# Patient Record
Sex: Male | Born: 1955 | Race: Black or African American | Hispanic: No | Marital: Single | Smoking: Never smoker
Health system: Southern US, Community
[De-identification: ages and names within clinical notes are randomized; demographics above are authoritative.]

## PROBLEM LIST (undated history)

## (undated) DIAGNOSIS — B20 Human immunodeficiency virus [HIV] disease: Secondary | ICD-10-CM

## (undated) HISTORY — PX: ABDOMINAL SURGERY: SHX537

## (undated) HISTORY — DX: Human immunodeficiency virus (HIV) disease: B20

---

## 2000-12-19 ENCOUNTER — Emergency Department (HOSPITAL_COMMUNITY): Admission: EM | Admit: 2000-12-19 | Discharge: 2000-12-19 | Payer: Self-pay | Admitting: Emergency Medicine

## 2000-12-22 ENCOUNTER — Emergency Department (HOSPITAL_COMMUNITY): Admission: EM | Admit: 2000-12-22 | Discharge: 2000-12-22 | Payer: Self-pay | Admitting: Emergency Medicine

## 2001-04-10 ENCOUNTER — Emergency Department (HOSPITAL_COMMUNITY): Admission: EM | Admit: 2001-04-10 | Discharge: 2001-04-10 | Payer: Self-pay | Admitting: Emergency Medicine

## 2001-04-16 ENCOUNTER — Emergency Department (HOSPITAL_COMMUNITY): Admission: EM | Admit: 2001-04-16 | Discharge: 2001-04-16 | Payer: Self-pay | Admitting: Emergency Medicine

## 2001-04-19 ENCOUNTER — Encounter: Payer: Self-pay | Admitting: *Deleted

## 2001-04-19 ENCOUNTER — Encounter (INDEPENDENT_AMBULATORY_CARE_PROVIDER_SITE_OTHER): Payer: Self-pay | Admitting: *Deleted

## 2001-04-20 ENCOUNTER — Inpatient Hospital Stay (HOSPITAL_COMMUNITY): Admission: EM | Admit: 2001-04-20 | Discharge: 2001-05-06 | Payer: Self-pay | Admitting: *Deleted

## 2001-04-20 ENCOUNTER — Encounter: Payer: Self-pay | Admitting: Pulmonary Disease

## 2001-04-20 ENCOUNTER — Encounter: Payer: Self-pay | Admitting: Internal Medicine

## 2001-04-20 ENCOUNTER — Encounter (INDEPENDENT_AMBULATORY_CARE_PROVIDER_SITE_OTHER): Payer: Self-pay | Admitting: *Deleted

## 2001-04-20 LAB — CONVERTED CEMR LAB
CD4 Count: 10 microliters
CD4 T Cell Abs: 10

## 2001-04-21 ENCOUNTER — Encounter: Payer: Self-pay | Admitting: Internal Medicine

## 2001-04-22 ENCOUNTER — Encounter: Payer: Self-pay | Admitting: Internal Medicine

## 2001-04-23 ENCOUNTER — Encounter: Payer: Self-pay | Admitting: Critical Care Medicine

## 2001-04-24 ENCOUNTER — Encounter: Payer: Self-pay | Admitting: Critical Care Medicine

## 2001-04-25 ENCOUNTER — Encounter: Payer: Self-pay | Admitting: Critical Care Medicine

## 2001-04-30 ENCOUNTER — Encounter: Payer: Self-pay | Admitting: Internal Medicine

## 2001-05-07 ENCOUNTER — Encounter: Payer: Self-pay | Admitting: Emergency Medicine

## 2001-05-07 ENCOUNTER — Emergency Department (HOSPITAL_COMMUNITY): Admission: EM | Admit: 2001-05-07 | Discharge: 2001-05-07 | Payer: Self-pay | Admitting: *Deleted

## 2001-05-15 ENCOUNTER — Encounter: Admission: RE | Admit: 2001-05-15 | Discharge: 2001-05-15 | Payer: Self-pay | Admitting: Infectious Diseases

## 2001-05-23 ENCOUNTER — Inpatient Hospital Stay (HOSPITAL_COMMUNITY): Admission: EM | Admit: 2001-05-23 | Discharge: 2001-05-27 | Payer: Self-pay | Admitting: Emergency Medicine

## 2001-05-23 ENCOUNTER — Encounter: Payer: Self-pay | Admitting: Emergency Medicine

## 2001-05-24 ENCOUNTER — Encounter: Payer: Self-pay | Admitting: Internal Medicine

## 2001-05-25 ENCOUNTER — Encounter: Payer: Self-pay | Admitting: Internal Medicine

## 2001-06-10 ENCOUNTER — Emergency Department (HOSPITAL_COMMUNITY): Admission: EM | Admit: 2001-06-10 | Discharge: 2001-06-10 | Payer: Self-pay

## 2001-06-11 ENCOUNTER — Encounter: Admission: RE | Admit: 2001-06-11 | Discharge: 2001-06-11 | Payer: Self-pay | Admitting: Infectious Diseases

## 2001-06-13 ENCOUNTER — Emergency Department (HOSPITAL_COMMUNITY): Admission: EM | Admit: 2001-06-13 | Discharge: 2001-06-13 | Payer: Self-pay | Admitting: Emergency Medicine

## 2001-06-14 ENCOUNTER — Emergency Department (HOSPITAL_COMMUNITY): Admission: EM | Admit: 2001-06-14 | Discharge: 2001-06-14 | Payer: Self-pay | Admitting: Emergency Medicine

## 2001-06-14 ENCOUNTER — Encounter: Payer: Self-pay | Admitting: Emergency Medicine

## 2001-06-19 ENCOUNTER — Encounter: Admission: RE | Admit: 2001-06-19 | Discharge: 2001-06-19 | Payer: Self-pay | Admitting: Infectious Diseases

## 2001-07-01 ENCOUNTER — Encounter: Payer: Self-pay | Admitting: Emergency Medicine

## 2001-07-01 ENCOUNTER — Emergency Department (HOSPITAL_COMMUNITY): Admission: EM | Admit: 2001-07-01 | Discharge: 2001-07-01 | Payer: Self-pay | Admitting: Emergency Medicine

## 2001-07-02 ENCOUNTER — Encounter: Admission: RE | Admit: 2001-07-02 | Discharge: 2001-07-02 | Payer: Self-pay

## 2001-07-05 ENCOUNTER — Encounter: Admission: RE | Admit: 2001-07-05 | Discharge: 2001-07-05 | Payer: Self-pay | Admitting: Infectious Diseases

## 2001-07-08 ENCOUNTER — Emergency Department (HOSPITAL_COMMUNITY): Admission: EM | Admit: 2001-07-08 | Discharge: 2001-07-08 | Payer: Self-pay | Admitting: Emergency Medicine

## 2001-07-11 ENCOUNTER — Emergency Department (HOSPITAL_COMMUNITY): Admission: EM | Admit: 2001-07-11 | Discharge: 2001-07-11 | Payer: Self-pay | Admitting: *Deleted

## 2001-08-21 ENCOUNTER — Ambulatory Visit (HOSPITAL_COMMUNITY): Admission: RE | Admit: 2001-08-21 | Discharge: 2001-08-21 | Payer: Self-pay | Admitting: Infectious Diseases

## 2001-08-21 ENCOUNTER — Encounter: Admission: RE | Admit: 2001-08-21 | Discharge: 2001-08-21 | Payer: Self-pay | Admitting: Infectious Diseases

## 2001-10-02 ENCOUNTER — Encounter: Admission: RE | Admit: 2001-10-02 | Discharge: 2001-10-02 | Payer: Self-pay | Admitting: Infectious Diseases

## 2001-10-02 ENCOUNTER — Ambulatory Visit (HOSPITAL_COMMUNITY): Admission: RE | Admit: 2001-10-02 | Discharge: 2001-10-02 | Payer: Self-pay | Admitting: Infectious Diseases

## 2001-10-08 ENCOUNTER — Ambulatory Visit (HOSPITAL_COMMUNITY): Admission: RE | Admit: 2001-10-08 | Discharge: 2001-10-08 | Payer: Self-pay | Admitting: Infectious Diseases

## 2001-10-08 ENCOUNTER — Encounter: Payer: Self-pay | Admitting: Infectious Diseases

## 2001-10-22 ENCOUNTER — Encounter: Payer: Self-pay | Admitting: Infectious Diseases

## 2001-10-22 ENCOUNTER — Ambulatory Visit (HOSPITAL_COMMUNITY): Admission: RE | Admit: 2001-10-22 | Discharge: 2001-10-22 | Payer: Self-pay | Admitting: Infectious Diseases

## 2001-10-24 ENCOUNTER — Emergency Department (HOSPITAL_COMMUNITY): Admission: EM | Admit: 2001-10-24 | Discharge: 2001-10-24 | Payer: Self-pay | Admitting: Emergency Medicine

## 2001-10-30 ENCOUNTER — Ambulatory Visit (HOSPITAL_COMMUNITY): Admission: RE | Admit: 2001-10-30 | Discharge: 2001-10-30 | Payer: Self-pay | Admitting: Infectious Diseases

## 2001-10-30 ENCOUNTER — Encounter (INDEPENDENT_AMBULATORY_CARE_PROVIDER_SITE_OTHER): Payer: Self-pay | Admitting: *Deleted

## 2001-10-30 ENCOUNTER — Encounter: Payer: Self-pay | Admitting: Infectious Diseases

## 2001-11-06 ENCOUNTER — Encounter: Admission: RE | Admit: 2001-11-06 | Discharge: 2001-11-06 | Payer: Self-pay | Admitting: Infectious Diseases

## 2001-12-03 ENCOUNTER — Encounter: Admission: RE | Admit: 2001-12-03 | Discharge: 2001-12-03 | Payer: Self-pay | Admitting: Infectious Diseases

## 2001-12-03 ENCOUNTER — Ambulatory Visit (HOSPITAL_COMMUNITY): Admission: RE | Admit: 2001-12-03 | Discharge: 2001-12-03 | Payer: Self-pay | Admitting: Infectious Diseases

## 2001-12-06 ENCOUNTER — Encounter: Payer: Self-pay | Admitting: Infectious Diseases

## 2001-12-06 ENCOUNTER — Ambulatory Visit (HOSPITAL_COMMUNITY): Admission: RE | Admit: 2001-12-06 | Discharge: 2001-12-06 | Payer: Self-pay | Admitting: Infectious Diseases

## 2001-12-10 ENCOUNTER — Encounter: Admission: RE | Admit: 2001-12-10 | Discharge: 2001-12-10 | Payer: Self-pay | Admitting: Infectious Diseases

## 2001-12-25 ENCOUNTER — Encounter: Admission: RE | Admit: 2001-12-25 | Discharge: 2001-12-25 | Payer: Self-pay | Admitting: Infectious Diseases

## 2002-01-15 ENCOUNTER — Ambulatory Visit (HOSPITAL_COMMUNITY): Admission: RE | Admit: 2002-01-15 | Discharge: 2002-01-15 | Payer: Self-pay | Admitting: Infectious Diseases

## 2002-01-15 ENCOUNTER — Encounter: Admission: RE | Admit: 2002-01-15 | Discharge: 2002-01-15 | Payer: Self-pay | Admitting: Infectious Diseases

## 2002-02-12 ENCOUNTER — Encounter: Admission: RE | Admit: 2002-02-12 | Discharge: 2002-02-12 | Payer: Self-pay | Admitting: Infectious Diseases

## 2002-03-10 ENCOUNTER — Encounter: Admission: RE | Admit: 2002-03-10 | Discharge: 2002-03-10 | Payer: Self-pay | Admitting: Infectious Diseases

## 2002-03-10 ENCOUNTER — Ambulatory Visit (HOSPITAL_COMMUNITY): Admission: RE | Admit: 2002-03-10 | Discharge: 2002-03-10 | Payer: Self-pay | Admitting: Infectious Diseases

## 2002-03-19 ENCOUNTER — Encounter: Admission: RE | Admit: 2002-03-19 | Discharge: 2002-03-19 | Payer: Self-pay | Admitting: Infectious Diseases

## 2002-04-28 ENCOUNTER — Encounter: Admission: RE | Admit: 2002-04-28 | Discharge: 2002-04-28 | Payer: Self-pay | Admitting: Infectious Diseases

## 2002-04-28 ENCOUNTER — Ambulatory Visit (HOSPITAL_COMMUNITY): Admission: RE | Admit: 2002-04-28 | Discharge: 2002-04-28 | Payer: Self-pay | Admitting: Infectious Diseases

## 2002-08-06 ENCOUNTER — Encounter: Admission: RE | Admit: 2002-08-06 | Discharge: 2002-08-06 | Payer: Self-pay | Admitting: Infectious Diseases

## 2002-08-06 ENCOUNTER — Encounter: Payer: Self-pay | Admitting: Infectious Diseases

## 2002-08-14 ENCOUNTER — Encounter: Admission: RE | Admit: 2002-08-14 | Discharge: 2002-08-14 | Payer: Self-pay | Admitting: Infectious Diseases

## 2002-08-27 ENCOUNTER — Encounter: Admission: RE | Admit: 2002-08-27 | Discharge: 2002-08-27 | Payer: Self-pay | Admitting: Infectious Diseases

## 2002-09-10 ENCOUNTER — Encounter: Admission: RE | Admit: 2002-09-10 | Discharge: 2002-09-10 | Payer: Self-pay | Admitting: Infectious Diseases

## 2002-11-05 ENCOUNTER — Encounter: Payer: Self-pay | Admitting: Infectious Diseases

## 2002-11-05 ENCOUNTER — Encounter: Admission: RE | Admit: 2002-11-05 | Discharge: 2002-11-05 | Payer: Self-pay | Admitting: Infectious Diseases

## 2002-11-05 ENCOUNTER — Ambulatory Visit (HOSPITAL_COMMUNITY): Admission: RE | Admit: 2002-11-05 | Discharge: 2002-11-05 | Payer: Self-pay | Admitting: Infectious Diseases

## 2002-12-31 ENCOUNTER — Encounter: Payer: Self-pay | Admitting: Infectious Diseases

## 2002-12-31 ENCOUNTER — Encounter: Admission: RE | Admit: 2002-12-31 | Discharge: 2002-12-31 | Payer: Self-pay | Admitting: Infectious Diseases

## 2002-12-31 ENCOUNTER — Ambulatory Visit (HOSPITAL_COMMUNITY): Admission: RE | Admit: 2002-12-31 | Discharge: 2002-12-31 | Payer: Self-pay | Admitting: Infectious Diseases

## 2003-01-05 ENCOUNTER — Ambulatory Visit (HOSPITAL_COMMUNITY): Admission: RE | Admit: 2003-01-05 | Discharge: 2003-01-05 | Payer: Self-pay | Admitting: Infectious Diseases

## 2003-01-05 ENCOUNTER — Encounter: Payer: Self-pay | Admitting: Infectious Diseases

## 2003-01-14 ENCOUNTER — Ambulatory Visit (HOSPITAL_COMMUNITY): Admission: RE | Admit: 2003-01-14 | Discharge: 2003-01-14 | Payer: Self-pay | Admitting: Infectious Diseases

## 2003-01-14 ENCOUNTER — Encounter: Payer: Self-pay | Admitting: Infectious Diseases

## 2003-01-14 ENCOUNTER — Encounter: Admission: RE | Admit: 2003-01-14 | Discharge: 2003-01-14 | Payer: Self-pay | Admitting: Infectious Diseases

## 2003-02-04 ENCOUNTER — Encounter: Admission: RE | Admit: 2003-02-04 | Discharge: 2003-02-04 | Payer: Self-pay | Admitting: Infectious Diseases

## 2003-02-26 ENCOUNTER — Encounter: Admission: RE | Admit: 2003-02-26 | Discharge: 2003-02-26 | Payer: Self-pay | Admitting: Gastroenterology

## 2003-03-23 ENCOUNTER — Ambulatory Visit (HOSPITAL_COMMUNITY): Admission: RE | Admit: 2003-03-23 | Discharge: 2003-03-23 | Payer: Self-pay | Admitting: Infectious Diseases

## 2003-03-23 ENCOUNTER — Encounter: Payer: Self-pay | Admitting: Infectious Diseases

## 2003-03-23 ENCOUNTER — Encounter: Admission: RE | Admit: 2003-03-23 | Discharge: 2003-03-23 | Payer: Self-pay | Admitting: Infectious Diseases

## 2003-04-27 ENCOUNTER — Encounter: Admission: RE | Admit: 2003-04-27 | Discharge: 2003-04-27 | Payer: Self-pay | Admitting: Gastroenterology

## 2003-04-30 ENCOUNTER — Ambulatory Visit (HOSPITAL_COMMUNITY): Admission: RE | Admit: 2003-04-30 | Discharge: 2003-04-30 | Payer: Self-pay | Admitting: Gastroenterology

## 2003-06-17 ENCOUNTER — Ambulatory Visit (HOSPITAL_COMMUNITY): Admission: RE | Admit: 2003-06-17 | Discharge: 2003-06-17 | Payer: Self-pay | Admitting: Infectious Diseases

## 2003-06-17 ENCOUNTER — Encounter: Admission: RE | Admit: 2003-06-17 | Discharge: 2003-06-17 | Payer: Self-pay | Admitting: Infectious Diseases

## 2003-09-15 ENCOUNTER — Encounter: Admission: RE | Admit: 2003-09-15 | Discharge: 2003-09-15 | Payer: Self-pay | Admitting: Infectious Diseases

## 2003-12-16 ENCOUNTER — Ambulatory Visit (HOSPITAL_COMMUNITY): Admission: RE | Admit: 2003-12-16 | Discharge: 2003-12-16 | Payer: Self-pay | Admitting: Infectious Diseases

## 2003-12-16 ENCOUNTER — Encounter: Admission: RE | Admit: 2003-12-16 | Discharge: 2003-12-16 | Payer: Self-pay | Admitting: Infectious Diseases

## 2004-02-24 ENCOUNTER — Ambulatory Visit (HOSPITAL_COMMUNITY): Admission: RE | Admit: 2004-02-24 | Discharge: 2004-02-24 | Payer: Self-pay | Admitting: Infectious Diseases

## 2004-02-24 ENCOUNTER — Ambulatory Visit: Payer: Self-pay | Admitting: Infectious Diseases

## 2004-03-04 ENCOUNTER — Emergency Department (HOSPITAL_COMMUNITY): Admission: EM | Admit: 2004-03-04 | Discharge: 2004-03-04 | Payer: Self-pay | Admitting: *Deleted

## 2004-03-06 ENCOUNTER — Emergency Department (HOSPITAL_COMMUNITY): Admission: EM | Admit: 2004-03-06 | Discharge: 2004-03-06 | Payer: Self-pay | Admitting: Emergency Medicine

## 2004-04-25 ENCOUNTER — Ambulatory Visit: Payer: Self-pay | Admitting: Infectious Diseases

## 2004-05-26 ENCOUNTER — Emergency Department (HOSPITAL_COMMUNITY): Admission: EM | Admit: 2004-05-26 | Discharge: 2004-05-27 | Payer: Self-pay | Admitting: Emergency Medicine

## 2004-05-29 ENCOUNTER — Emergency Department (HOSPITAL_COMMUNITY): Admission: EM | Admit: 2004-05-29 | Discharge: 2004-05-29 | Payer: Self-pay | Admitting: Emergency Medicine

## 2004-06-07 ENCOUNTER — Emergency Department (HOSPITAL_COMMUNITY): Admission: EM | Admit: 2004-06-07 | Discharge: 2004-06-07 | Payer: Self-pay | Admitting: Emergency Medicine

## 2004-08-23 ENCOUNTER — Ambulatory Visit (HOSPITAL_COMMUNITY): Admission: RE | Admit: 2004-08-23 | Discharge: 2004-08-23 | Payer: Self-pay | Admitting: Infectious Diseases

## 2004-08-23 ENCOUNTER — Ambulatory Visit: Payer: Self-pay | Admitting: Infectious Diseases

## 2004-11-23 ENCOUNTER — Ambulatory Visit (HOSPITAL_COMMUNITY): Admission: RE | Admit: 2004-11-23 | Discharge: 2004-11-23 | Payer: Self-pay | Admitting: Infectious Diseases

## 2004-11-23 ENCOUNTER — Ambulatory Visit: Payer: Self-pay | Admitting: Infectious Diseases

## 2005-01-01 ENCOUNTER — Emergency Department (HOSPITAL_COMMUNITY): Admission: EM | Admit: 2005-01-01 | Discharge: 2005-01-01 | Payer: Self-pay | Admitting: Emergency Medicine

## 2005-02-01 ENCOUNTER — Ambulatory Visit (HOSPITAL_COMMUNITY): Admission: RE | Admit: 2005-02-01 | Discharge: 2005-02-01 | Payer: Self-pay | Admitting: Infectious Diseases

## 2005-02-01 ENCOUNTER — Encounter (INDEPENDENT_AMBULATORY_CARE_PROVIDER_SITE_OTHER): Payer: Self-pay | Admitting: *Deleted

## 2005-02-01 ENCOUNTER — Ambulatory Visit: Payer: Self-pay | Admitting: Infectious Diseases

## 2005-02-01 LAB — CONVERTED CEMR LAB
CD4 Count: 180 microliters
HIV 1 RNA Quant: 14200 copies/mL

## 2005-03-08 ENCOUNTER — Ambulatory Visit: Payer: Self-pay | Admitting: Infectious Diseases

## 2005-03-31 ENCOUNTER — Ambulatory Visit: Payer: Self-pay | Admitting: Infectious Diseases

## 2005-03-31 ENCOUNTER — Ambulatory Visit (HOSPITAL_COMMUNITY): Admission: RE | Admit: 2005-03-31 | Discharge: 2005-03-31 | Payer: Self-pay | Admitting: Infectious Diseases

## 2005-03-31 ENCOUNTER — Encounter (INDEPENDENT_AMBULATORY_CARE_PROVIDER_SITE_OTHER): Payer: Self-pay | Admitting: *Deleted

## 2006-05-30 ENCOUNTER — Emergency Department (HOSPITAL_COMMUNITY): Admission: EM | Admit: 2006-05-30 | Discharge: 2006-05-30 | Payer: Self-pay | Admitting: Emergency Medicine

## 2006-06-11 ENCOUNTER — Encounter (INDEPENDENT_AMBULATORY_CARE_PROVIDER_SITE_OTHER): Payer: Self-pay | Admitting: *Deleted

## 2006-06-11 LAB — CONVERTED CEMR LAB: RPR Titer: 4

## 2006-06-24 ENCOUNTER — Encounter (INDEPENDENT_AMBULATORY_CARE_PROVIDER_SITE_OTHER): Payer: Self-pay | Admitting: *Deleted

## 2006-08-21 ENCOUNTER — Ambulatory Visit: Payer: Self-pay | Admitting: Infectious Diseases

## 2006-08-21 ENCOUNTER — Encounter: Admission: RE | Admit: 2006-08-21 | Discharge: 2006-08-21 | Payer: Self-pay | Admitting: Infectious Diseases

## 2006-08-21 DIAGNOSIS — A530 Latent syphilis, unspecified as early or late: Secondary | ICD-10-CM | POA: Insufficient documentation

## 2006-08-21 DIAGNOSIS — Z8709 Personal history of other diseases of the respiratory system: Secondary | ICD-10-CM | POA: Insufficient documentation

## 2006-08-21 DIAGNOSIS — B20 Human immunodeficiency virus [HIV] disease: Secondary | ICD-10-CM | POA: Insufficient documentation

## 2006-08-21 DIAGNOSIS — K589 Irritable bowel syndrome without diarrhea: Secondary | ICD-10-CM | POA: Insufficient documentation

## 2006-08-21 LAB — CONVERTED CEMR LAB
ALT: 29 units/L (ref 0–53)
Alkaline Phosphatase: 70 units/L (ref 39–117)
Basophils Absolute: 0 10*3/uL (ref 0.0–0.1)
Basophils Relative: 1 % (ref 0–1)
CD4 Count: 80 microliters
Creatinine, Ser: 0.78 mg/dL (ref 0.40–1.50)
Eosinophils Absolute: 0.1 10*3/uL (ref 0.0–0.7)
Eosinophils Relative: 4 % (ref 0–5)
HCT: 43.5 % (ref 39.0–52.0)
HIV-1 RNA Quant, Log: 5.19 — ABNORMAL HIGH (ref <1.70)
Hemoglobin, Urine: NEGATIVE
Hemoglobin: 14.9 g/dL (ref 13.0–17.0)
Ketones, ur: NEGATIVE mg/dL
LDL Cholesterol: 69 mg/dL (ref 0–99)
Leukocytes, UA: NEGATIVE
MCHC: 34.3 g/dL (ref 30.0–36.0)
MCV: 95.3 fL (ref 78.0–100.0)
Monocytes Absolute: 0.2 10*3/uL (ref 0.2–0.7)
Platelets: 128 10*3/uL — ABNORMAL LOW (ref 150–400)
Protein, ur: 30 mg/dL — AB
RDW: 13.4 % (ref 11.5–14.0)
RPR Titer: 1:2 {titer}
Sodium: 139 meq/L (ref 135–145)
Total Bilirubin: 1.2 mg/dL (ref 0.3–1.2)
Total CHOL/HDL Ratio: 3.6
Total Protein: 8 g/dL (ref 6.0–8.3)
Urine Glucose: NEGATIVE mg/dL
VLDL: 18 mg/dL (ref 0–40)
pH: 6 (ref 5.0–8.0)

## 2006-08-25 ENCOUNTER — Emergency Department (HOSPITAL_COMMUNITY): Admission: EM | Admit: 2006-08-25 | Discharge: 2006-08-25 | Payer: Self-pay | Admitting: Emergency Medicine

## 2006-09-04 ENCOUNTER — Ambulatory Visit: Payer: Self-pay | Admitting: Infectious Diseases

## 2006-09-07 ENCOUNTER — Encounter: Payer: Self-pay | Admitting: Infectious Diseases

## 2006-09-07 ENCOUNTER — Telehealth: Payer: Self-pay

## 2006-09-24 ENCOUNTER — Telehealth: Payer: Self-pay

## 2006-09-26 ENCOUNTER — Telehealth: Payer: Self-pay | Admitting: Infectious Diseases

## 2006-09-26 ENCOUNTER — Telehealth: Payer: Self-pay

## 2006-09-28 ENCOUNTER — Telehealth: Payer: Self-pay | Admitting: Infectious Diseases

## 2006-10-17 ENCOUNTER — Ambulatory Visit: Payer: Self-pay | Admitting: Infectious Diseases

## 2006-10-17 ENCOUNTER — Encounter: Admission: RE | Admit: 2006-10-17 | Discharge: 2006-10-17 | Payer: Self-pay | Admitting: Infectious Diseases

## 2006-10-17 LAB — CONVERTED CEMR LAB
ALT: 17 units/L (ref 0–53)
AST: 19 units/L (ref 0–37)
Albumin: 4.1 g/dL (ref 3.5–5.2)
CO2: 24 meq/L (ref 19–32)
Calcium: 9.1 mg/dL (ref 8.4–10.5)
Chloride: 104 meq/L (ref 96–112)
HCT: 41.2 % (ref 39.0–52.0)
HIV 1 RNA Quant: 35400 copies/mL — ABNORMAL HIGH (ref <50)
Platelets: 185 10*3/uL (ref 150–400)
Potassium: 3.7 meq/L (ref 3.5–5.3)
RDW: 13.5 % (ref 11.5–14.0)
Sodium: 139 meq/L (ref 135–145)
Total Protein: 8.9 g/dL — ABNORMAL HIGH (ref 6.0–8.3)
WBC: 2.2 10*3/uL — ABNORMAL LOW (ref 4.0–10.5)

## 2006-10-18 ENCOUNTER — Encounter: Payer: Self-pay | Admitting: Infectious Diseases

## 2007-02-11 ENCOUNTER — Ambulatory Visit: Payer: Self-pay | Admitting: Infectious Diseases

## 2007-02-11 ENCOUNTER — Encounter: Admission: RE | Admit: 2007-02-11 | Discharge: 2007-02-11 | Payer: Self-pay | Admitting: Infectious Diseases

## 2007-02-11 LAB — CONVERTED CEMR LAB
AST: 14 units/L (ref 0–37)
Alkaline Phosphatase: 66 units/L (ref 39–117)
Basophils Relative: 0 % (ref 0–1)
Eosinophils Absolute: 0 10*3/uL (ref 0.0–0.7)
Eosinophils Relative: 1 % (ref 0–5)
Glucose, Bld: 86 mg/dL (ref 70–99)
HCT: 41.8 % (ref 39.0–52.0)
HIV-1 RNA Quant, Log: 4.89 — ABNORMAL HIGH (ref <1.70)
Lymphs Abs: 1.2 10*3/uL (ref 0.7–3.3)
MCHC: 33.3 g/dL (ref 30.0–36.0)
MCV: 94.6 fL (ref 78.0–100.0)
Neutrophils Relative %: 30 % — ABNORMAL LOW (ref 43–77)
Platelets: 145 10*3/uL — ABNORMAL LOW (ref 150–400)
RDW: 13.7 % (ref 11.5–14.0)
Sodium: 142 meq/L (ref 135–145)
Total Bilirubin: 1 mg/dL (ref 0.3–1.2)
Total Protein: 8.6 g/dL — ABNORMAL HIGH (ref 6.0–8.3)
WBC: 2.2 10*3/uL — ABNORMAL LOW (ref 4.0–10.5)

## 2007-02-25 ENCOUNTER — Ambulatory Visit: Payer: Self-pay | Admitting: Infectious Diseases

## 2007-02-25 LAB — CONVERTED CEMR LAB

## 2007-03-27 ENCOUNTER — Ambulatory Visit: Payer: Self-pay | Admitting: Infectious Diseases

## 2007-04-04 ENCOUNTER — Telehealth: Payer: Self-pay | Admitting: Infectious Diseases

## 2007-04-17 ENCOUNTER — Encounter (INDEPENDENT_AMBULATORY_CARE_PROVIDER_SITE_OTHER): Payer: Self-pay | Admitting: *Deleted

## 2007-05-08 ENCOUNTER — Ambulatory Visit: Payer: Self-pay | Admitting: Infectious Diseases

## 2007-05-08 ENCOUNTER — Encounter: Admission: RE | Admit: 2007-05-08 | Discharge: 2007-05-08 | Payer: Self-pay | Admitting: Infectious Diseases

## 2007-05-08 LAB — CONVERTED CEMR LAB
Albumin: 4.2 g/dL (ref 3.5–5.2)
BUN: 14 mg/dL (ref 6–23)
Calcium: 8.9 mg/dL (ref 8.4–10.5)
Chloride: 104 meq/L (ref 96–112)
Cholesterol: 122 mg/dL (ref 0–200)
Glucose, Bld: 92 mg/dL (ref 70–99)
HCT: 41.5 % (ref 39.0–52.0)
HDL: 29 mg/dL — ABNORMAL LOW (ref >39)
Hemoglobin: 14 g/dL (ref 13.0–17.0)
MCHC: 33.7 g/dL (ref 30.0–36.0)
Potassium: 3.8 meq/L (ref 3.5–5.3)
RBC: 4.6 M/uL (ref 4.22–5.81)
Triglycerides: 166 mg/dL — ABNORMAL HIGH (ref <150)

## 2007-05-13 LAB — CONVERTED CEMR LAB: HIV-1 RNA Quant, Log: 4.75 — ABNORMAL HIGH (ref <1.70)

## 2007-05-22 ENCOUNTER — Telehealth: Payer: Self-pay | Admitting: Infectious Diseases

## 2007-07-23 ENCOUNTER — Encounter: Admission: RE | Admit: 2007-07-23 | Discharge: 2007-07-23 | Payer: Self-pay | Admitting: Infectious Diseases

## 2007-07-23 ENCOUNTER — Ambulatory Visit: Payer: Self-pay | Admitting: Infectious Diseases

## 2007-07-23 LAB — CONVERTED CEMR LAB
CO2: 25 meq/L (ref 19–32)
Eosinophils Relative: 2 % (ref 0–5)
Glucose, Bld: 82 mg/dL (ref 70–99)
HCT: 38.7 % — ABNORMAL LOW (ref 39.0–52.0)
HIV 1 RNA Quant: 51500 copies/mL — ABNORMAL HIGH (ref <50)
HIV-1 RNA Quant, Log: 4.71 — ABNORMAL HIGH (ref <1.70)
Hemoglobin: 13.4 g/dL (ref 13.0–17.0)
Lymphocytes Relative: 50 % — ABNORMAL HIGH (ref 12–46)
Lymphs Abs: 1.4 10*3/uL (ref 0.7–4.0)
Monocytes Absolute: 0.4 10*3/uL (ref 0.1–1.0)
Platelets: 125 10*3/uL — ABNORMAL LOW (ref 150–400)
RBC: 4.33 M/uL (ref 4.22–5.81)
Sodium: 141 meq/L (ref 135–145)
Total Bilirubin: 1.2 mg/dL (ref 0.3–1.2)
Total Protein: 9.1 g/dL — ABNORMAL HIGH (ref 6.0–8.3)
WBC: 2.9 10*3/uL — ABNORMAL LOW (ref 4.0–10.5)

## 2007-08-02 ENCOUNTER — Telehealth (INDEPENDENT_AMBULATORY_CARE_PROVIDER_SITE_OTHER): Payer: Self-pay | Admitting: *Deleted

## 2007-08-07 ENCOUNTER — Ambulatory Visit: Payer: Self-pay | Admitting: Infectious Diseases

## 2007-08-23 ENCOUNTER — Telehealth (INDEPENDENT_AMBULATORY_CARE_PROVIDER_SITE_OTHER): Payer: Self-pay | Admitting: *Deleted

## 2007-11-13 ENCOUNTER — Ambulatory Visit: Payer: Self-pay | Admitting: Infectious Diseases

## 2007-11-13 ENCOUNTER — Encounter: Admission: RE | Admit: 2007-11-13 | Discharge: 2007-11-13 | Payer: Self-pay | Admitting: Infectious Diseases

## 2007-11-13 LAB — CONVERTED CEMR LAB
ALT: 11 units/L (ref 0–53)
AST: 14 units/L (ref 0–37)
Albumin: 4.2 g/dL (ref 3.5–5.2)
BUN: 13 mg/dL (ref 6–23)
Calcium: 8.9 mg/dL (ref 8.4–10.5)
Chloride: 105 meq/L (ref 96–112)
Hemoglobin: 13.6 g/dL (ref 13.0–17.0)
Platelets: 125 10*3/uL — ABNORMAL LOW (ref 150–400)
Potassium: 3.6 meq/L (ref 3.5–5.3)
RDW: 14.2 % (ref 11.5–15.5)
Sodium: 140 meq/L (ref 135–145)
Total Protein: 8.9 g/dL — ABNORMAL HIGH (ref 6.0–8.3)

## 2007-11-27 ENCOUNTER — Ambulatory Visit: Payer: Self-pay | Admitting: Infectious Diseases

## 2007-12-11 ENCOUNTER — Telehealth (INDEPENDENT_AMBULATORY_CARE_PROVIDER_SITE_OTHER): Payer: Self-pay | Admitting: *Deleted

## 2007-12-23 ENCOUNTER — Emergency Department (HOSPITAL_COMMUNITY): Admission: EM | Admit: 2007-12-23 | Discharge: 2007-12-23 | Payer: Self-pay | Admitting: Emergency Medicine

## 2007-12-30 ENCOUNTER — Encounter: Payer: Self-pay | Admitting: Infectious Diseases

## 2008-01-01 ENCOUNTER — Ambulatory Visit: Payer: Self-pay | Admitting: Infectious Diseases

## 2008-01-03 ENCOUNTER — Telehealth (INDEPENDENT_AMBULATORY_CARE_PROVIDER_SITE_OTHER): Payer: Self-pay | Admitting: *Deleted

## 2008-01-10 ENCOUNTER — Encounter: Payer: Self-pay | Admitting: Infectious Diseases

## 2008-01-23 ENCOUNTER — Emergency Department (HOSPITAL_COMMUNITY): Admission: EM | Admit: 2008-01-23 | Discharge: 2008-01-23 | Payer: Self-pay | Admitting: Emergency Medicine

## 2008-02-09 ENCOUNTER — Emergency Department (HOSPITAL_COMMUNITY): Admission: EM | Admit: 2008-02-09 | Discharge: 2008-02-09 | Payer: Self-pay | Admitting: Emergency Medicine

## 2008-02-17 ENCOUNTER — Encounter: Payer: Self-pay | Admitting: Infectious Diseases

## 2008-03-02 ENCOUNTER — Telehealth (INDEPENDENT_AMBULATORY_CARE_PROVIDER_SITE_OTHER): Payer: Self-pay | Admitting: *Deleted

## 2008-03-02 ENCOUNTER — Ambulatory Visit: Payer: Self-pay | Admitting: Infectious Diseases

## 2008-03-02 DIAGNOSIS — J40 Bronchitis, not specified as acute or chronic: Secondary | ICD-10-CM | POA: Insufficient documentation

## 2008-03-02 LAB — CONVERTED CEMR LAB
Albumin: 3.7 g/dL (ref 3.5–5.2)
Alkaline Phosphatase: 65 units/L (ref 39–117)
BUN: 10 mg/dL (ref 6–23)
CO2: 22 meq/L (ref 19–32)
Eosinophils Absolute: 0.2 10*3/uL (ref 0.0–0.7)
Eosinophils Relative: 3 % (ref 0–5)
Glucose, Bld: 93 mg/dL (ref 70–99)
HCT: 39.9 % (ref 39.0–52.0)
HIV 1 RNA Quant: 938 copies/mL — ABNORMAL HIGH (ref <48)
HIV-1 RNA Quant, Log: 2.97 — ABNORMAL HIGH (ref <1.68)
Hemoglobin: 13 g/dL (ref 13.0–17.0)
Lymphs Abs: 2.5 10*3/uL (ref 0.7–4.0)
MCV: 93.2 fL (ref 78.0–100.0)
Monocytes Relative: 9 % (ref 3–12)
Potassium: 3.8 meq/L (ref 3.5–5.3)
RBC: 4.28 M/uL (ref 4.22–5.81)
Total Bilirubin: 0.7 mg/dL (ref 0.3–1.2)
WBC: 5.1 10*3/uL (ref 4.0–10.5)

## 2008-03-17 ENCOUNTER — Encounter: Payer: Self-pay | Admitting: Infectious Diseases

## 2008-03-27 ENCOUNTER — Telehealth (INDEPENDENT_AMBULATORY_CARE_PROVIDER_SITE_OTHER): Payer: Self-pay | Admitting: *Deleted

## 2008-03-30 ENCOUNTER — Encounter: Payer: Self-pay | Admitting: Infectious Diseases

## 2008-05-13 ENCOUNTER — Ambulatory Visit: Payer: Self-pay | Admitting: Infectious Diseases

## 2008-05-13 LAB — CONVERTED CEMR LAB
ALT: 15 units/L (ref 0–53)
Albumin: 3.8 g/dL (ref 3.5–5.2)
Basophils Absolute: 0 10*3/uL (ref 0.0–0.1)
CO2: 22 meq/L (ref 19–32)
Calcium: 8.6 mg/dL (ref 8.4–10.5)
Chloride: 106 meq/L (ref 96–112)
Cholesterol: 98 mg/dL (ref 0–200)
Eosinophils Relative: 1 % (ref 0–5)
Glucose, Bld: 93 mg/dL (ref 70–99)
HCT: 38 % — ABNORMAL LOW (ref 39.0–52.0)
HIV 1 RNA Quant: 82800 copies/mL — ABNORMAL HIGH (ref <48)
HIV-1 RNA Quant, Log: 4.92 — ABNORMAL HIGH (ref <1.68)
Lymphocytes Relative: 46 % (ref 12–46)
Lymphs Abs: 1.4 10*3/uL (ref 0.7–4.0)
Neutro Abs: 1 10*3/uL — ABNORMAL LOW (ref 1.7–7.7)
Platelets: 241 10*3/uL (ref 150–400)
Potassium: 3.6 meq/L (ref 3.5–5.3)
RPR Titer: 1:1 {titer}
Sodium: 140 meq/L (ref 135–145)
Total Bilirubin: 0.6 mg/dL (ref 0.3–1.2)
Total Protein: 8.2 g/dL (ref 6.0–8.3)
Triglycerides: 74 mg/dL (ref <150)
WBC: 2.9 10*3/uL — ABNORMAL LOW (ref 4.0–10.5)

## 2008-06-01 ENCOUNTER — Ambulatory Visit: Payer: Self-pay | Admitting: Infectious Diseases

## 2008-08-14 ENCOUNTER — Emergency Department (HOSPITAL_COMMUNITY): Admission: EM | Admit: 2008-08-14 | Discharge: 2008-08-14 | Payer: Self-pay | Admitting: Emergency Medicine

## 2008-08-26 ENCOUNTER — Ambulatory Visit: Payer: Self-pay | Admitting: Infectious Diseases

## 2008-08-26 LAB — CONVERTED CEMR LAB
ALT: 20 units/L (ref 0–53)
AST: 22 units/L (ref 0–37)
Albumin: 3.8 g/dL (ref 3.5–5.2)
Alkaline Phosphatase: 64 units/L (ref 39–117)
BUN: 10 mg/dL (ref 6–23)
Basophils Relative: 0 % (ref 0–1)
Calcium: 8.6 mg/dL (ref 8.4–10.5)
Chloride: 105 meq/L (ref 96–112)
Eosinophils Absolute: 0.1 10*3/uL (ref 0.0–0.7)
Eosinophils Relative: 2 % (ref 0–5)
HCT: 38.4 % — ABNORMAL LOW (ref 39.0–52.0)
Lymphs Abs: 1.5 10*3/uL (ref 0.7–4.0)
MCHC: 35.4 g/dL (ref 30.0–36.0)
MCV: 89.5 fL (ref 78.0–100.0)
Monocytes Relative: 12 % (ref 3–12)
Neutrophils Relative %: 31 % — ABNORMAL LOW (ref 43–77)
Platelets: 155 10*3/uL (ref 150–400)
Potassium: 3.4 meq/L — ABNORMAL LOW (ref 3.5–5.3)
RBC: 4.29 M/uL (ref 4.22–5.81)
Sodium: 138 meq/L (ref 135–145)
WBC: 2.8 10*3/uL — ABNORMAL LOW (ref 4.0–10.5)

## 2008-09-01 ENCOUNTER — Encounter: Payer: Self-pay | Admitting: Infectious Diseases

## 2008-09-08 ENCOUNTER — Encounter: Payer: Self-pay | Admitting: Infectious Diseases

## 2008-09-09 ENCOUNTER — Ambulatory Visit: Payer: Self-pay | Admitting: Infectious Diseases

## 2008-09-09 LAB — CONVERTED CEMR LAB

## 2008-09-23 ENCOUNTER — Telehealth (INDEPENDENT_AMBULATORY_CARE_PROVIDER_SITE_OTHER): Payer: Self-pay | Admitting: *Deleted

## 2008-09-28 ENCOUNTER — Telehealth: Payer: Self-pay | Admitting: Infectious Diseases

## 2008-10-21 ENCOUNTER — Telehealth (INDEPENDENT_AMBULATORY_CARE_PROVIDER_SITE_OTHER): Payer: Self-pay | Admitting: *Deleted

## 2008-11-06 ENCOUNTER — Ambulatory Visit: Payer: Self-pay | Admitting: Infectious Diseases

## 2008-11-06 LAB — CONVERTED CEMR LAB
ALT: 16 units/L (ref 0–53)
Albumin: 3.8 g/dL (ref 3.5–5.2)
CO2: 21 meq/L (ref 19–32)
Calcium: 8.5 mg/dL (ref 8.4–10.5)
Chloride: 107 meq/L (ref 96–112)
Eosinophils Absolute: 0.1 10*3/uL (ref 0.0–0.7)
Glucose, Bld: 93 mg/dL (ref 70–99)
HIV 1 RNA Quant: 114000 copies/mL — ABNORMAL HIGH (ref <48)
HIV-1 RNA Quant, Log: 5.06 — ABNORMAL HIGH (ref <1.68)
Lymphocytes Relative: 49 % — ABNORMAL HIGH (ref 12–46)
Lymphs Abs: 1.5 10*3/uL (ref 0.7–4.0)
Neutro Abs: 1 10*3/uL — ABNORMAL LOW (ref 1.7–7.7)
Neutrophils Relative %: 34 % — ABNORMAL LOW (ref 43–77)
Platelets: 145 10*3/uL — ABNORMAL LOW (ref 150–400)
RBC: 4.41 M/uL (ref 4.22–5.81)
Sodium: 141 meq/L (ref 135–145)
Total Protein: 8.3 g/dL (ref 6.0–8.3)
WBC: 3.1 10*3/uL — ABNORMAL LOW (ref 4.0–10.5)

## 2008-11-18 ENCOUNTER — Ambulatory Visit: Payer: Self-pay | Admitting: Infectious Diseases

## 2008-11-18 LAB — CONVERTED CEMR LAB

## 2008-11-19 ENCOUNTER — Telehealth (INDEPENDENT_AMBULATORY_CARE_PROVIDER_SITE_OTHER): Payer: Self-pay | Admitting: *Deleted

## 2008-12-18 ENCOUNTER — Telehealth (INDEPENDENT_AMBULATORY_CARE_PROVIDER_SITE_OTHER): Payer: Self-pay | Admitting: *Deleted

## 2009-02-09 ENCOUNTER — Encounter: Payer: Self-pay | Admitting: Infectious Diseases

## 2010-03-09 ENCOUNTER — Encounter (INDEPENDENT_AMBULATORY_CARE_PROVIDER_SITE_OTHER): Payer: Self-pay | Admitting: *Deleted

## 2010-05-19 NOTE — Miscellaneous (Signed)
  Clinical Lists Changes  Observations: Added new observation of YEARAIDSPOS: 2003  (03/09/2010 9:10)

## 2010-07-26 LAB — T-HELPER CELL (CD4) - (RCID CLINIC ONLY): CD4 % Helper T Cell: 7 % — ABNORMAL LOW (ref 33–55)

## 2010-09-02 NOTE — Op Note (Signed)
Spencer Mcclure, Spencer Mcclure                            ACCOUNT NO.:  [id number]   MEDICAL RECORD NO.:  [id number]                   PATIENT TYPE:  AMB   LOCATION:  ENDO                                 FACILITY:  [facility]   PHYSICIAN:  Graylin Shiver, M.D.                DATE OF BIRTH:  Aug 19, 1955   DATE OF PROCEDURE:  04/30/2003  DATE OF DISCHARGE:                                 OPERATIVE REPORT   PROCEDURE:  Esophagogastroduodenoscopy.   INDICATIONS FOR PROCEDURE:  This patient is a 55 year old male with a  history of AIDS. He has complaints of abdominal pain of uncertain etiology.  His pain does seem to have improved with the use of Librax. A recent  barium  enema was negative. A recent  upper GI with small bowel follow through  raised the question of an abnormality and slight narrowing of the antrum in  the stomach. An endoscopy is being done to evaluate. Informed consent was  obtained after explanation of the risks of bleeding, infection and  perforation.   PREMEDICATION:  Fentanyl 75 micrograms IV, Versed 9 mg IV.   DESCRIPTION OF PROCEDURE:  With the patient in the left lateral decubitus  position the Olympus gastroscope was inserted into the oropharynx and passed  into the esophagus. It was advanced  down the esophagus and then into the  stomach  and into the duodenum.   The 2nd portion of the bulb of the duodenum looked normal. The stomach  looked normal in its entirety. I saw no evidence of narrowing in the antrum  or pylorus area. No lesions were seen in the fundus or cardia on  retroflexion. The esophagus looked normal in its entirety and  the  esophagogastric junction was 41 cm from the incisor teeth. The patient  tolerated the procedure well without complications.   IMPRESSION:  Normal esophagogastroduodenoscopy.                                               Graylin Shiver, M.D.    SFG/MEDQ  D:  04/30/2003  T:  04/30/2003  Job:  [id number]   cc:   Lacretia Leigh. Ninetta Lights, M.D.  [address]  [place]  [place] [address]  Fax: [phone]

## 2010-09-02 NOTE — Procedures (Signed)
[place]. [facility]  Patient:    Spencer Mcclure, Spencer Mcclure Visit Number: [id number] MRN: [id number]          Service Type: MED Location: MICU 2103 01 Attending Physician:  Gwynneth Aliment Proc. Date: 04/20/01 Admit Date:  04/19/2001                             Procedure Report  PROCEDURE:   Fiberoptic bronchoscopy for the purposes of endotracheal intubation and diagnostic bronchoscopy.  SURGEON:  ___________.  INDICATIONS FOR PROCEDURE:  Respiratory failure and extensive bilateral pulmonary infiltrates.  The patient needs airway control and diagnostic bronchoscopy.  The patient is a 54 year old African-American male who presented with bilateral infiltrates which have now become diffuse, and the patient has developed florid respiratory failure.  He requires mechanical ventilation. There is high suspicion for Pneumocystis carinii pneumonia and bronchoalveolar lavage is necessary for diagnosis.  Consent was obtained verbally from the patient.  The procedure was semi-emergent.  The above was also discussed with the patients mother.  DESCRIPTION OF PROCEDURE:  The patient was sedated with a total of Versed 10 mg and received 150 mcg of fentanyl IV.  His oral mucosa was anesthetized with Hurricaine spray.  The patient was placed in a semirecumbent position.  A bite block was placed, and at this point, the Pentax fiberoptic bronchoscope was passed over a #8 ET tube.  The scope was then passed via the oral route to the posterior pharynx.  Extensive thrush could be seen.  The scope was then advanced through the vocal cords.  The trachea appeared inflamed.  At this point, the ET tube was advanced and taped at 24 cm at the lip which equated to 3 cm above the carina.  At this point, the scope was retrieved.  The patient was placed on mechanical ventilator support and the tube was secured in place.  Once the patient was on mechanical ventilation, a Portex adapter was  attached to the ET tube.  The scope was then again passed for inspection of the airway which revealed diffuse erythematous and edematous changes throughout the entire left and right tracheal bronchial trees.  No endobronchial lesions could be identified.  The scope was then wedged to the right middle lobe and a 40 cc normal saline bronchoalveolar lavage was done with 20 cc return.  This was sent for evaluation.  IMPRESSION: 1. Respiratory failure secondary to extensive pulmonary infiltrates. 2. Endotracheal tube successfully placed at 3 cm above the carina.  PLAN:  To be to await laboratory data.  The patient will remain on ventilatory support until his condition improves. Attending Physician:  Gwynneth Aliment DD:  04/20/01 TD:  04/21/01 Job: [id number]

## 2010-09-02 NOTE — Discharge Summary (Signed)
[place]. [facility]  Patient:    Spencer Mcclure, Spencer Mcclure Visit Number: [id number] MRN: [id number]          Service Type: MED Location: 5000 [id number] 01 Attending Physician:  Phifer, Trinna Post Dictated by:   Thayer Headings, M.D. Admit Date:  05/23/2001 Disc. Date: 05/27/01   CC:         Dewayne Shorter, M.D.  [facility]   Discharge Summary  DISCHARGE DIAGNOSES:  1. Shortness of breath, likely secondary to PCP.  2. Positive mycobacteria, AVM complex, sputum culture from April 20, 2001.  3. 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  4. Oral thrush.  5. Insomnia of unknown origin.  6. Normocytic anemia, unknown cause.  7. Increased blood sugars on steroids.  8. History of alcohol abuse in the past, says he quit 1 year ago.  9. 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  Has been treated with penicillin. 10. 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 11. Hepatitis B positive, January 2003. 12. Hepatitis A positive, January 2003.  DISCHARGE MEDICATIONS:  1. Septra double strength.  2 tabs every 8 hours x 21 days.  2. Prednisone 40 mg b.i.d. x 2 days and prednisone 40 mg q.d. x 5 days and     then prednisone 20 mg q.d. x 11 days.  3. Azithromycin 1200 mg q. week.  4. Xanax 0.5 mg t.i.d. and q.h.s.  5. Resume prophylaxis doses of Septra after treatment doses are finished.  DISPOSITION:  Discharge to home with mother.  Will follow up with Dr. Ninetta Lights on June 19, 2001, at 9:30 a.m.  He is to come to [facility] if he has any symptoms between now and then.  CONSULTS:  Dr. Cliffton Asters of the ID service.  HISTORY OF PRESENT ILLNESS:  The patient is a 55 year old African-American male with a history of HIV diagnosed in January 2003, who complains of weakness, worsening shortness of breath, exertion, suprapubic pain x 2 weeks, urinary frequency without dysuria, and a dry cough.  He was recently hospitalized in January  2003, for PCP and pneumonia at which time he found about his HIV diagnosis.  His hospitalization included intubation for the PCP.  Currently he denies any headache, chest pain, nausea, vomiting or diarrhea, shortness of breath.  He dad noticed that he cannot walk across the room without catching his breath.  His cough is nonproductive without hemoptysis. The urinary frequency has been going on since his last hospitalization.  He says he keeps a container next to his bed at night and urinates about once an hour.  PAST MEDICAL HISTORY:  1. HIV diagnosed last month with a CD4 count of 10.  Viral load of 394,000 at     that time.  2. PCP with intubation in January 2003, treated with Bactrim and steroids.  3. History of esophageal thrush.  4. 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  5. 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  6. Hepatitis B.  7. Hepatitis A.  8. Recent Mycobacterium complex in sputum.  9. History of alcohol abuse quit 1 year ago.  MEDICATIONS:  ON admission:  1. Septra double strength 1 tab q. day.  2. Azithromycin 1200 mg q. week.  ALLERGIES:  No known drug allergies.  SOCIAL HISTORY:  He lives with his mother.  He worked at a [employer] before January.  He reports using alcohol up to 1 year ago  and stopped.  Denies any tobacco or drugs.  Before last month he was sexually active with multiple females.  FAMILY HISTORY:  His mother is alive at age 2.  She has asthma.  He does not know his father.  PHYSICAL EXAMINATION:  VITAL SIGNS:  Temperature 100.5.  Blood pressure 90/56, pulse 100, respirations 20.  Saturation 95% on room air.  GENERAL:  Thin, African-American male in mild respiratory distress.  HEENT:  Normocephalic and atraumatic.  Temporal wasting bilaterally.  Pupils, equal, round, reactive to light.  Extraocular movements are intact. Oropharynx with mild thrush.  NECK:  Supple without JVD or bruit.  CHEST:  Clear to auscultation bilaterally.  Positive CVA tenderness on  the left.  CARDIOVASCULAR:  Regular rate and rhythm.  No murmurs, rubs or gallops.  ABDOMEN:  Soft, tender in the left lower quadrant to deep palpation. Normoactive bowel sounds.  Tender at the right upper quadrant with deep palpation.  EXTREMITIES:  2+ pulses in the lower extremities.  No edema, cyanosis or clubbing.  Tender to palpation in the bilateral legs.  NEUROLOGIC:  No focal deficits.  LABORATORY DATA:  ABG shows of pH 7.48, pCO2 of 28, pO2 66, bicarbonate 21 on room air.  WBC 1.7, hemoglobin 10.1, platelets 406, with differentials of segs 52, lymphs 41, monos 3.  Sodium 130, potassium 3.8, chloride 102, CO2 22, creatinine 0.6, BUN 13, glucose 102, calcium 7.8.  Chest x-ray shows bilateral patchy infiltrates left greater than right with a ground glass appearance.  HOSPITAL COURSE: #1. HYPOXIA SECONDARY TO PULMONARY INFECTION:  At the time of admission there were several different possibilities for Spencer Mcclure pneumonia.  It most likely was PCP pneumonia due to his hypoxia and the look of his chest x-ray.  Also considered was MAC pneumonia as he had had a positive culture during his last hospitalization that did not turn positive until after he was discharged. Tuberculosis was also considered although less likely with the look of his chest x-ray.  He was admitted to a step-down bed for his severe hypoxia and started on therapy for PCP, and for MAC. He improved fairly quickly after starting his antibiotics.  It was learned on the day of discharge that he had been taking his prophylactic Bactrim incorrectly.  I was told he was taking the Bactrim once a week and not taking the Azithromycin at all.  The Azithromycin was supposed to be once a week.  Because then of a likely PCP pneumonia has been established he will continue taking Bactrim and a  prednisone taper as an outpatient now for another 3 weeks and he will follow up with Dr. Ninetta Lights.  His TB skin test will be read  tomorrow.  Today it had not indurated at all.  According to the infectious disease consultation Spencer Mcclure is very unlikely to have Mycobacterium AVM pneumonia as he does not fit the typical picture.  His empiric treatment for this will be stopped before he leaves the hospital.  2.  HIV:  The patient is following with the infectious disease doctors at [facility].  He will start on HAART therapy as soon as his finances are worked out.  He is currently applying for medicaid, I believe.  3. ORAL THRUSH:  Spencer Mcclure received a course of Diflucan in the hospital.  His thrush has resolved now and he will not continue on Diflucan until he gets another case of the thrush.  4. URINARY FREQUENCY/CVA TENDERNESS:  The patient during urinalysis was  negative for any signs of infection.  His urine culture showed 80,000 colonies of nonspecific bacteria.   The treatment for his pulmonary problems should also treat any urinary pathogens that he does have.  His abdominal pain had resolved by the last day of hospitalization.  DISCHARGE LABORATORY DATA:  Sodium 149, potassium 3.5, chloride 111, CO2 22, glucose 105, BUN 5, creatinine 0.6, calcium 8.1, WBC 2.1, hemoglobin 9.7, platelets 461. Dictated by:   Thayer Headings, M.D. Attending Physician:  Phifer, Trinna Post DD:  05/27/01 TD:  05/27/01 Job: [id number]

## 2010-09-02 NOTE — Discharge Summary (Signed)
. [facility]  Patient:    Spencer Mcclure, Spencer Mcclure Visit Number: [id number] MRN: [id number]          Service Type: EMS Location: MINO Attending Physician:  Corlis Leak. Dictated by:   Marisue Brooklyn, M.D. Admit Date:  05/07/2001 Discharge Date: 05/07/2001   CC:         Tinnie Gens C. Ninetta Lights, M.D.   Discharge Summary  DATE OF BIRTH:  Jan 19, 1956  HOME ADDRESS:  [address], [place], [place] [address].  PHONE NUMBER:  [phone].  DISCHARGE DIAGNOSES: 1. 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 3. Oral and esophageal thrush. 4. 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 5. 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  DISCHARGE MEDICATIONS: 1. Septra double strength two pills t.i.d. x6 days then stop. 2. Azithromycin 600 mg two pills q.a.m. every Thursday only. 3. Prednisone 20 mg one pill q.d. x6 days then stop.  CONDITION ON DISCHARGE:  Stable.  FOLLOWUP:  Mr. Melland has a follow-up appointment with Dr. Johny Sax in the [facility] on May 15, 2001, at 3:15 p.m.  PROCEDURES: 1. The patient was intubated on April 20, 2001.  Extubated on April 24, 2001. 2. Diagnostic bronchoscopy performed on April 20, 2001, showed positive lavage for Pneumocystis carinii pneumonia.  HISTORY OF PRESENT ILLNESS:  Mr. Garrelts is a 55 year old African-American male who presented with a chief complaint of weakness.  He also complained of shortness of breath and a sore throat.  He had also lost at least 20 pounds over the past six months without trying to.  PAST MEDICAL HISTORY:  None.  PAST SURGICAL HISTORY:  Tonsillectomy as a child.  MEDICATIONS:  Vicodin p.r.n. pain.  ALLERGIES:  No known drug allergies.  FAMILY HISTORY:  Mother is 34, has asthma.  Father unknown.  He is an only child.  SOCIAL HISTORY:  Lives with his mother.   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  Denies tobacco, elicit drug use, alcohol use.  He is sexually active with females.  REVIEW OF SYSTEMS:  No headache.  He has had a cough.  He denies abdominal pain.  He has had nausea.  Denies diarrhea.  Denies asthma.  PHYSICAL EXAMINATION:  VITAL SIGNS:  Temperature 100.6, blood pressure 110/60, heart rate 136, respiratory rate 38, 85% oxygen saturation on room air.  GENERAL:  A cachectic male, ill-appearing.  HEENT:  Temporal wasting and icteric sclerae.  Poor dentition with caries, and upper lip ulceration without drainage.  Positive for thrush and dry mucous membranes.  NECK:  No jugular venous distension, no carotid bruits.  CHEST:  Bilateral rhonchi with wheezes on the left.  CARDIAC:  Tachycardia, no murmurs, rubs, or gallops.  ABDOMEN:  Soft, nontender, nondistended.  EXTREMITIES:  No edema, distal pulses palpable.  NEUROLOGIC:  Aware, alert and oriented x3.  ADMISSION LABORATORY DATA:  Chest x-ray showed bilateral air space disease. White blood cell count 6.7, hemoglobin 11.9, hematocrit 33.4, platelets 305. Sodium 140, potassium 3.0, chloride 110, BUN 16, creatinine 0.6, platelets 147.  pH 7.471.  HOSPITAL COURSE:  #1 - PNEUMOCYSTIS CARINII PNEUMONIA:  The patient was initially admitted to the step-down clinic and begun on Tequin.  His respiratory status decompensated, and he underwent bronchoscopy and intubation on April 20, 2001.  The bronchioloalveolar lavage done with the bronchoscopy revealed Pneumocystis carinii pneumonia.  He was placed on Bactrim, and remained intubated until  April 24, 2001, when he was able to be weaned off of the respirator.  The patient also had a CT of the chest on April 20, 2001, that showed bilateral air space disease consistent with Pneumocystis carinii pneumonia.  After the patient was extubated he was transferred to the floor, and over the course of his hospital stay his  oxygen saturation gradually improved, and he was weaned from 4 L of oxygen on April 25, 2001, at 90% saturation to 94% saturation on room air before discharge.  At discharge, while ambulating, the patient would desaturate to about 90% on room air, however, he was asymptomatic, and this was a significant improvement from when he was first extubated.  The patient will continue on Septra double strength two pills t.i.d. x6 days after discharge, for a total of approximately 24 days of antibiotics.  This should finish his course of therapy for the PCP pneumonia.  Again, he will consult with Dr. Ninetta Lights on May 15, 2001, regarding need for prophylaxis.  The patient will also finish his prednisone taper from the pneumonia, which is 20 mg q.d. x6 days, and then he will also stop that.  #2 - END STAGE ACQUIRED IMMUNODEFICIENCY SYNDROME:  HIV was diagnosed and confirmed.  His CD4 count was found to be less than 10, and his viral load was 394,216.  Care management was consulted, and they are working on the ADAP application to provide funding for HAART therapy, and also an application for disability and Medicare funding.  Unfortunately, this will not be ready by the time the patient is discharged.  Therefore, we will plan to keep the patient on prophylaxis on discharge, and await further funding, as well as his follow-up appointment with Dr. Ninetta Lights on May 15, 2001.  #3 - SYNCOPE:  The patient had reported feeling faint prior to admission, and so a head CT with and without contrast had been performed which was negative for any organic disease.  We assumed that his blacking out was due to his Pneumocystis carinii pneumonia, and resulting weakness and dehydration.  #4 - ORAL AND ESOPHAGEAL THRUSH:  The patient was treated with 200 mg of Fluconazole, and the thrush resolved, however, the patients throat remained sore until the end of his hospital stay.   #5 - LATENT SYPHILIS:  RPR reactive  on April 21, 2001.  The patient is currently asymptomatic.  #6 - HERPES SIMPLEX VIRUS ON LIPS:  Treated with acyclovir during hospital stay.  This problem will continue to need to be addressed as an outpatient. Dictated by:   Marisue Brooklyn, M.D. Attending Physician:  Corlis Leak DD:  05/07/01 TD:  05/08/01 Job: [id number]

## 2010-11-09 ENCOUNTER — Ambulatory Visit: Payer: Self-pay | Admitting: Infectious Diseases

## 2010-11-15 ENCOUNTER — Emergency Department (HOSPITAL_COMMUNITY)
Admission: EM | Admit: 2010-11-15 | Discharge: 2010-11-16 | Disposition: A | Discharge status: Home / Self Care | Payer: Medicare Other | Attending: Emergency Medicine | Admitting: Emergency Medicine

## 2010-11-15 DIAGNOSIS — M545 Low back pain, unspecified: Secondary | ICD-10-CM | POA: Insufficient documentation

## 2010-11-15 DIAGNOSIS — S335XXA Sprain of ligaments of lumbar spine, initial encounter: Secondary | ICD-10-CM | POA: Insufficient documentation

## 2010-11-15 DIAGNOSIS — X58XXXA Exposure to other specified factors, initial encounter: Secondary | ICD-10-CM | POA: Insufficient documentation

## 2010-11-15 DIAGNOSIS — B20 Human immunodeficiency virus [HIV] disease: Secondary | ICD-10-CM | POA: Insufficient documentation

## 2010-11-16 ENCOUNTER — Emergency Department (HOSPITAL_COMMUNITY): Payer: Medicare Other

## 2010-11-16 LAB — URINALYSIS, ROUTINE W REFLEX MICROSCOPIC
Hgb urine dipstick: NEGATIVE
Protein, ur: 30 mg/dL — AB
Urobilinogen, UA: 1 mg/dL (ref 0.0–1.0)

## 2010-11-16 LAB — URINE MICROSCOPIC-ADD ON

## 2010-11-28 ENCOUNTER — Other Ambulatory Visit: Payer: Self-pay

## 2010-12-12 ENCOUNTER — Ambulatory Visit: Payer: Self-pay | Admitting: Infectious Diseases

## 2011-01-04 ENCOUNTER — Other Ambulatory Visit (INDEPENDENT_AMBULATORY_CARE_PROVIDER_SITE_OTHER): Payer: Medicare Other

## 2011-01-04 ENCOUNTER — Ambulatory Visit: Payer: Medicare Other

## 2011-01-04 DIAGNOSIS — B20 Human immunodeficiency virus [HIV] disease: Secondary | ICD-10-CM

## 2011-01-04 DIAGNOSIS — Z113 Encounter for screening for infections with a predominantly sexual mode of transmission: Secondary | ICD-10-CM

## 2011-01-04 DIAGNOSIS — Z79899 Other long term (current) drug therapy: Secondary | ICD-10-CM

## 2011-01-05 LAB — CBC WITH DIFFERENTIAL/PLATELET
Basophils Absolute: 0 10*3/uL (ref 0.0–0.1)
Eosinophils Absolute: 0.1 10*3/uL (ref 0.0–0.7)
Eosinophils Relative: 5 % (ref 0–5)
MCH: 29.5 pg (ref 26.0–34.0)
MCHC: 33 g/dL (ref 30.0–36.0)
MCV: 89.2 fL (ref 78.0–100.0)
Platelets: 143 10*3/uL — ABNORMAL LOW (ref 150–400)
RDW: 13.1 % (ref 11.5–15.5)
WBC: 2.4 10*3/uL — ABNORMAL LOW (ref 4.0–10.5)

## 2011-01-05 LAB — URINALYSIS, ROUTINE W REFLEX MICROSCOPIC
Hgb urine dipstick: NEGATIVE
Ketones, ur: NEGATIVE mg/dL
Nitrite: NEGATIVE
Urobilinogen, UA: 1 mg/dL (ref 0.0–1.0)

## 2011-01-05 LAB — T-HELPER CELL (CD4) - (RCID CLINIC ONLY): CD4 % Helper T Cell: 7 — ABNORMAL LOW

## 2011-01-05 LAB — RPR

## 2011-01-05 LAB — LIPID PANEL
HDL: 30 mg/dL — ABNORMAL LOW (ref >39)
LDL Cholesterol: 46 mg/dL (ref 0–99)
Total CHOL/HDL Ratio: 3.5 Ratio

## 2011-01-05 LAB — PATHOLOGIST SMEAR REVIEW

## 2011-01-10 LAB — T-HELPER CELL (CD4) - (RCID CLINIC ONLY)
CD4 % Helper T Cell: 6 — ABNORMAL LOW
CD4 T Cell Abs: 80 — ABNORMAL LOW

## 2011-01-13 LAB — T-HELPER CELL (CD4) - (RCID CLINIC ONLY): CD4 % Helper T Cell: 6 — ABNORMAL LOW

## 2011-01-16 LAB — T-HELPER CELL (CD4) - (RCID CLINIC ONLY): CD4 T Cell Abs: 60 — ABNORMAL LOW

## 2011-01-18 ENCOUNTER — Ambulatory Visit: Payer: Medicare Other

## 2011-01-18 ENCOUNTER — Ambulatory Visit: Payer: Medicare Other | Admitting: Infectious Diseases

## 2011-01-18 ENCOUNTER — Ambulatory Visit: Payer: Self-pay | Admitting: Infectious Diseases

## 2011-01-18 LAB — BASIC METABOLIC PANEL
BUN: 8
CO2: 28
Chloride: 102
Glucose, Bld: 88
Potassium: 3.3 — ABNORMAL LOW
Sodium: 137

## 2011-01-18 LAB — CBC
HCT: 38.8 — ABNORMAL LOW
Hemoglobin: 13.3
MCV: 93.9
Platelets: 116 — ABNORMAL LOW
WBC: 2.7 — ABNORMAL LOW

## 2011-01-18 LAB — HEPATIC FUNCTION PANEL
ALT: 14
AST: 22
Alkaline Phosphatase: 59
Bilirubin, Direct: <0.1
Total Bilirubin: 1

## 2011-01-18 LAB — URINALYSIS, ROUTINE W REFLEX MICROSCOPIC
Bilirubin Urine: NEGATIVE
Glucose, UA: NEGATIVE
Hgb urine dipstick: NEGATIVE
Ketones, ur: NEGATIVE
Nitrite: NEGATIVE
Specific Gravity, Urine: 1.01
pH: 7

## 2011-01-18 LAB — DIFFERENTIAL
Band Neutrophils: 1
Eosinophils Relative: 1
Metamyelocytes Relative: 0
Myelocytes: 0
nRBC: 0

## 2011-01-19 ENCOUNTER — Ambulatory Visit: Payer: Medicare Other

## 2011-01-25 LAB — T-HELPER CELL (CD4) - (RCID CLINIC ONLY): CD4 % Helper T Cell: 7 — ABNORMAL LOW

## 2011-02-01 ENCOUNTER — Inpatient Hospital Stay (INDEPENDENT_AMBULATORY_CARE_PROVIDER_SITE_OTHER)
Admission: RE | Admit: 2011-02-01 | Discharge: 2011-02-01 | Disposition: A | Discharge status: Home / Self Care | Payer: Medicare Other | Source: Ambulatory Visit | Attending: Family Medicine | Admitting: Family Medicine

## 2011-02-01 ENCOUNTER — Emergency Department (HOSPITAL_COMMUNITY)
Admission: EM | Admit: 2011-02-01 | Discharge: 2011-02-01 | Disposition: A | Discharge status: Home / Self Care | Payer: Medicare Other | Attending: Emergency Medicine | Admitting: Emergency Medicine

## 2011-02-01 ENCOUNTER — Emergency Department (HOSPITAL_COMMUNITY): Payer: Medicare Other

## 2011-02-01 DIAGNOSIS — B20 Human immunodeficiency virus [HIV] disease: Secondary | ICD-10-CM | POA: Insufficient documentation

## 2011-02-01 DIAGNOSIS — R51 Headache: Secondary | ICD-10-CM

## 2011-02-08 ENCOUNTER — Encounter: Payer: Self-pay | Admitting: Infectious Diseases

## 2011-02-08 ENCOUNTER — Ambulatory Visit (INDEPENDENT_AMBULATORY_CARE_PROVIDER_SITE_OTHER): Payer: Medicare Other | Admitting: Infectious Diseases

## 2011-02-08 VITALS — BP 135/83 | HR 76 | Temp 98.0°F | Ht 66.0 in | Wt 199.0 lb

## 2011-02-08 DIAGNOSIS — Z23 Encounter for immunization: Secondary | ICD-10-CM

## 2011-02-08 DIAGNOSIS — B20 Human immunodeficiency virus [HIV] disease: Secondary | ICD-10-CM

## 2011-02-08 NOTE — Progress Notes (Signed)
Addended by: Mariea Clonts D on: 02/08/2011 05:06 PM   Modules accepted: Orders

## 2011-02-08 NOTE — Progress Notes (Signed)
  Subjective:    Patient ID: Spencer Mcclure, male    DOB: 06-29-1955, 55 y.o.   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  Previously visit was started on DRVr/[employer]entress/TRV. CD4 80 and VL 114,000 (5-10).Genotype 09-09-08 M41L, M184V, H208Y, T215F.   Prev CD4 80 and VL 87,400 (01-04-11).  States he has been missing his meds about 2x/week.      Review of Systems  Constitutional: Negative for appetite change and unexpected weight change.  Gastrointestinal: Negative for diarrhea and constipation.  Genitourinary: Negative for dysuria.       Objective:   Physical Exam  Constitutional: He appears well-developed and well-nourished.  Eyes: EOM are normal. Pupils are equal, round, and reactive to light.  Neck: Neck supple.  Cardiovascular: Normal rate, regular rhythm and normal heart sounds.   Pulmonary/Chest: Effort normal and breath sounds normal.  Abdominal: Soft. Bowel sounds are normal. There is no tenderness.  Lymphadenopathy:    He has no cervical adenopathy.          Assessment & Plan:

## 2011-02-08 NOTE — Assessment & Plan Note (Signed)
Clinically doing well, immunoilogically doing poorly. Will repeat his genotype today. Needs to be more adherent. Given condoms and flu shot. Will see him back in 1 month.

## 2011-02-09 LAB — COMPREHENSIVE METABOLIC PANEL
ALT: 13 U/L (ref 0–53)
AST: 21 U/L (ref 0–37)
CO2: 21 mEq/L (ref 19–32)
Calcium: 8.8 mg/dL (ref 8.4–10.5)
Chloride: 103 mEq/L (ref 96–112)
Creat: 0.84 mg/dL (ref 0.50–1.35)
Sodium: 137 mEq/L (ref 135–145)
Total Bilirubin: 0.7 mg/dL (ref 0.3–1.2)
Total Protein: 8.6 g/dL — ABNORMAL HIGH (ref 6.0–8.3)

## 2011-02-10 LAB — HIV-1 RNA ULTRAQUANT REFLEX TO GENTYP+
HIV 1 RNA Quant: 191000 copies/mL — ABNORMAL HIGH (ref <20)
HIV-1 RNA Quant, Log: 5.28 {Log} — ABNORMAL HIGH (ref <1.30)

## 2011-02-14 LAB — HIV-1 GENOTYPR PLUS

## 2011-03-15 ENCOUNTER — Ambulatory Visit: Payer: Medicare Other | Admitting: Infectious Diseases

## 2011-03-29 ENCOUNTER — Ambulatory Visit: Payer: Medicare Other | Admitting: Infectious Diseases

## 2011-06-20 ENCOUNTER — Telehealth: Payer: Self-pay | Admitting: *Deleted

## 2011-06-20 NOTE — Telephone Encounter (Signed)
Doctor called from [place] ED, patient is being seen there for a hernia, he has moved.  Stated he had HIV and had his medicine with him.  At last visit 01/2011 a genotype was done and patient was suppose to f/u in one month. Advised doctor to get a signed release, gave our fax number and if they need labs faxed will send a signed request. Wendall Mola CMA

## 2011-07-24 ENCOUNTER — Encounter (HOSPITAL_COMMUNITY): Payer: Self-pay | Admitting: *Deleted

## 2011-07-24 ENCOUNTER — Emergency Department (HOSPITAL_COMMUNITY)
Admission: EM | Admit: 2011-07-24 | Discharge: 2011-07-24 | Disposition: A | Discharge status: Home / Self Care | Payer: Medicare Other | Attending: Emergency Medicine | Admitting: Emergency Medicine

## 2011-07-24 DIAGNOSIS — J45909 Unspecified asthma, uncomplicated: Secondary | ICD-10-CM | POA: Insufficient documentation

## 2011-07-24 DIAGNOSIS — R109 Unspecified abdominal pain: Secondary | ICD-10-CM | POA: Insufficient documentation

## 2011-07-24 LAB — CBC
HCT: 36.9 % — ABNORMAL LOW (ref 39.0–52.0)
Hemoglobin: 12.9 g/dL — ABNORMAL LOW (ref 13.0–17.0)
MCH: 30.4 pg (ref 26.0–34.0)
MCHC: 35 g/dL (ref 30.0–36.0)
MCV: 86.8 fL (ref 78.0–100.0)
Platelets: 176 10*3/uL (ref 150–400)
RBC: 4.25 MIL/uL (ref 4.22–5.81)
RDW: 13.6 % (ref 11.5–15.5)
WBC: 3 10*3/uL — ABNORMAL LOW (ref 4.0–10.5)

## 2011-07-24 LAB — COMPREHENSIVE METABOLIC PANEL
ALT: 13 U/L (ref 0–53)
AST: 17 U/L (ref 0–37)
Albumin: 3.7 g/dL (ref 3.5–5.2)
Alkaline Phosphatase: 62 U/L (ref 39–117)
BUN: 13 mg/dL (ref 6–23)
CO2: 28 mEq/L (ref 19–32)
Calcium: 9.2 mg/dL (ref 8.4–10.5)
Chloride: 104 mEq/L (ref 96–112)
Creatinine, Ser: 0.74 mg/dL (ref 0.50–1.35)
GFR calc Af Amer: >90 mL/min (ref >90)
GFR calc non Af Amer: >90 mL/min (ref >90)
Glucose, Bld: 85 mg/dL (ref 70–99)
Potassium: 3.4 mEq/L — ABNORMAL LOW (ref 3.5–5.1)
Sodium: 141 mEq/L (ref 135–145)
Total Bilirubin: 0.7 mg/dL (ref 0.3–1.2)
Total Protein: 8.6 g/dL — ABNORMAL HIGH (ref 6.0–8.3)

## 2011-07-24 MED ORDER — OXYCODONE-ACETAMINOPHEN 5-325 MG PO TABS: 1.0000 | ORAL_TABLET | ORAL | Status: AC | PRN

## 2011-07-24 MED ORDER — ALBUTEROL SULFATE HFA 108 (90 BASE) MCG/ACT IN AERS: 1.0000 | INHALATION_SPRAY | Freq: Four times a day (QID) | RESPIRATORY_TRACT | Status: AC | PRN

## 2011-07-24 MED ORDER — IBUPROFEN 200 MG PO TABS
400.0000 mg | ORAL_TABLET | Freq: Once | ORAL | Status: AC
  Administered 2011-07-24: 400 mg via ORAL
  Filled 2011-07-24: qty 2

## 2011-07-24 MED ORDER — OXYCODONE-ACETAMINOPHEN 5-325 MG PO TABS
1.0000 | ORAL_TABLET | Freq: Once | ORAL | Status: AC
Start: 2011-07-24 — End: 2011-07-24
  Administered 2011-07-24: 1 via ORAL
  Filled 2011-07-24: qty 1

## 2011-07-24 NOTE — ED Notes (Signed)
He had hernia surgery march 8th in [place] dc.  hes here today for pain med

## 2011-07-24 NOTE — Discharge Instructions (Signed)
Abdominal Pain Abdominal pain can be caused by many things. Your caregiver decides the seriousness of your pain by an examination and possibly blood tests and X-rays. Many cases can be observed and treated at home. Most abdominal pain is not caused by a disease and will probably improve without treatment. However, in many cases, more time must pass before a clear cause of the pain can be found. Before that point, it may not be known if you need more testing, or if hospitalization or surgery is needed. HOME CARE INSTRUCTIONS   Do not take laxatives unless directed by your caregiver.   Take pain medicine only as directed by your caregiver.   Only take over-the-counter or prescription medicines for pain, discomfort, or fever as directed by your caregiver.   Try a clear liquid diet (broth, tea, or water) for as long as directed by your caregiver. Slowly move to a bland diet as tolerated.  SEEK IMMEDIATE MEDICAL CARE IF:   The pain does not go away.   You have a fever.   You keep throwing up (vomiting).   The pain is felt only in portions of the abdomen. Pain in the right side could possibly be appendicitis. In an adult, pain in the left lower portion of the abdomen could be colitis or diverticulitis.   You pass bloody or black tarry stools.  MAKE SURE YOU:   Understand these instructions.   Will watch your condition.   Will get help right away if you are not doing well or get worse.  Document Released: 01/11/2005 Document Revised: 03/23/2011 Document Reviewed: 11/20/2007 ExitCare Patient Information 2012 [employer]  RESOURCE GUIDE  Dental Problems  Patients with Medicaid:  Family Dentistry                     [facility] [address]                                           [address] Phone:  [phone]                                                  Phone:  [phone]  If unable to pay or uninsured, contact:  [facility] or [facility] to become qualified for the adult dental clinic.  Chronic Pain Problems Contact [facility]  [phone] Patients need to be referred by their primary care doctor.  Insufficient Money for Medicine Contact [employer]:  call "211" or [facility] 271-5999.  No Primary Care Doctor Call [facility]  832-8000 Other agencies that provide inexpensive medical care    [facility]  832-8035    [facility]  [phone]    [facility]  [phone]    [facility] Clinic  [phone]    [facility]  [phone]    [facility]  [phone]  Psychological Services [facility]  [phone] [facility]  [phone] [facility]   800 853-5163 (emergency services 641-4993)  Substance Abuse Resources [facility]  336-882-2125 [facility] 336-784-9470 [facility] 336-285-9073 [facility] 336-845-3988 Residential & Outpatient Substance Abuse Program  800-659-3381    Abuse/Neglect [place] Child Abuse Hotline (336) 641-3795 [place] Child Abuse Hotline 800-378-5315 (After Hours)  Emergency Shelter [facility] (336) 271-5985  [facility] (336) 275-9566 [facility] (704) 372-4663  MRSA Hotline #:   832-7006    [place] [facility]  [facility] [place]     [employer]                          [facility] [address] [place]                       [address]      [address]  [place]                                                [place]                            [place] Phone:  [phone]                                   Phone:  [phone]                 Phone:  [phone]  [facility] Phone:  [phone]  [place] Child Abuse Hotline (336) 342-1394 (336) 342-3537 (After  Hours)   

## 2011-07-24 NOTE — ED Notes (Signed)
To ed for eval of abd pain. States he isn't sure what kind, but he had surgery last month in [place] on abd. Pain is at umbilicus. abd soft. Having normal bm's. Denies difficulty with urination

## 2011-08-03 NOTE — ED Provider Notes (Signed)
History    55yM with abdominal pain. [place] to "[place] Continued pain although improving. No n/v. No urinary complaints. No discharge. NO back pain. NO fever or chills.  CSN: [id number]  Arrival date & time 07/24/11  1328   First MD Initiated Contact with Patient 07/24/11 1643      Chief Complaint  Patient presents with  . Abdominal Pain    (Consider location/radiation/quality/duration/timing/severity/associated sxs/prior treatment) HPI  Past Medical History  Diagnosis Date  . Asthma     Past Surgical History  Procedure Date  . Abdominal surgery     History reviewed. No pertinent family history.  History  Substance Use Topics  . Smoking status: Not on file  . Smokeless tobacco: Not on file  . Alcohol Use: No      Review of Systems   Review of symptoms negative unless otherwise noted in HPI.   Allergies  Review of patient's allergies indicates no known allergies.  Home Medications   Current Outpatient Rx  Name Route Sig Dispense Refill  . DARUNAVIR ETHANOLATE 400 MG PO TABS Oral Take 800 mg by mouth daily.      Marland Kitchen EMTRICITABINE-TENOFOVIR 200-300 MG PO TABS Oral Take 1 tablet by mouth daily.      Marland Kitchen ETRAVIRINE 200 MG PO TABS Oral Take 1 tablet by mouth 2 (two) times daily.      . MULTIVITAMINS PO CAPS Oral Take 1 capsule by mouth daily.      Marland Kitchen RITONAVIR 100 MG PO TABS Oral Take 100 mg by mouth daily.      . SULFAMETHOXAZOLE-TMP DS 800-160 MG PO TABS Oral Take 1 tablet by mouth daily.      . ALBUTEROL SULFATE HFA 108 (90 BASE) MCG/ACT IN AERS Inhalation Inhale 1-2 puffs into the lungs every 6 (six) hours as needed for wheezing. 2 Inhaler 0  . OXYCODONE-ACETAMINOPHEN 5-325 MG PO TABS Oral Take 1-2 tablets by mouth every 4 (four) hours as needed for pain. 15 tablet 0    BP 134/85  Pulse 96  Temp(Src) 98.5 F (36.9 C) (Oral)  Resp 20  SpO2 100%  Physical Exam  Nursing note and vitals reviewed. Constitutional:  He appears well-developed and well-nourished. No distress.  HENT:  Head: Normocephalic and atraumatic.  Eyes: Conjunctivae are normal. Right eye exhibits no discharge. Left eye exhibits no discharge.  Neck: Neck supple.  Cardiovascular: Normal rate, regular rhythm and normal heart sounds.  Exam reveals no gallop and no friction rub.   No murmur heard. Pulmonary/Chest: Effort normal and breath sounds normal. No respiratory distress.  Abdominal: Soft. He exhibits no distension and no mass. There is no tenderness. There is no rebound and no guarding.       Well healing surgical wound at umbilicus. without surrounding skin changes. No drainage.  Genitourinary:       No cva tenderness. Normal external male genitalia. No inguinal adenopathy. No testicular tenderness or scrotal swelling. No discharge.   Musculoskeletal: He exhibits no edema and no tenderness.  Neurological: He is alert.  Skin: Skin is warm and dry.  Psychiatric: He has a normal mood and affect. His behavior is normal. Thought content normal.    ED Course  Procedures (including critical care time)  Labs Reviewed  CBC - Abnormal; Notable for the following:    WBC 3.0 (*)    Hemoglobin 12.9 (*)    HCT 36.9 (*)    All other components within  normal limits  COMPREHENSIVE METABOLIC PANEL - Abnormal; Notable for the following:    Potassium 3.4 (*)    Total Protein 8.6 (*)    All other components within normal limits  LAB REPORT - SCANNED   No results found.   1. Abdominal pain       MDM  55yM with abdominal pain. S/p what sound like umbilical hernia repair. Suspect mild continued post op pain. Benign abdominal exam. Low concern for complication or surgical abdomen. Plan symptomatic tx. Return precautions discussed. Outpt fu.        Raeford Razor, MD 08/03/11 [phone]

## 2012-06-12 ENCOUNTER — Other Ambulatory Visit (HOSPITAL_COMMUNITY)
Admission: RE | Admit: 2012-06-12 | Discharge: 2012-06-12 | Disposition: A | Discharge status: Home / Self Care | Payer: Medicare Other | Source: Ambulatory Visit | Attending: Infectious Disease | Admitting: Infectious Disease

## 2012-06-12 ENCOUNTER — Other Ambulatory Visit: Payer: Self-pay | Admitting: Infectious Disease

## 2012-06-12 ENCOUNTER — Other Ambulatory Visit (INDEPENDENT_AMBULATORY_CARE_PROVIDER_SITE_OTHER): Payer: Medicare Other

## 2012-06-12 DIAGNOSIS — Z79899 Other long term (current) drug therapy: Secondary | ICD-10-CM

## 2012-06-12 DIAGNOSIS — Z113 Encounter for screening for infections with a predominantly sexual mode of transmission: Secondary | ICD-10-CM | POA: Insufficient documentation

## 2012-06-12 DIAGNOSIS — B20 Human immunodeficiency virus [HIV] disease: Secondary | ICD-10-CM

## 2012-06-12 LAB — COMPREHENSIVE METABOLIC PANEL
ALT: 9 U/L (ref 0–53)
AST: 15 U/L (ref 0–37)
Albumin: 4.1 g/dL (ref 3.5–5.2)
BUN: 12 mg/dL (ref 6–23)
Calcium: 9.2 mg/dL (ref 8.4–10.5)
Chloride: 102 mEq/L (ref 96–112)
Potassium: 3.5 mEq/L (ref 3.5–5.3)

## 2012-06-12 LAB — LIPID PANEL
HDL: 34 mg/dL — ABNORMAL LOW (ref >39)
LDL Cholesterol: 59 mg/dL (ref 0–99)
Total CHOL/HDL Ratio: 3.1 Ratio

## 2012-06-13 LAB — CBC WITH DIFFERENTIAL/PLATELET
Basophils Absolute: 0 10*3/uL (ref 0.0–0.1)
HCT: 35.7 % — ABNORMAL LOW (ref 39.0–52.0)
Hemoglobin: 12.5 g/dL — ABNORMAL LOW (ref 13.0–17.0)
Lymphocytes Relative: 37 % (ref 12–46)
Lymphs Abs: 0.6 10*3/uL — ABNORMAL LOW (ref 0.7–4.0)
Monocytes Absolute: 0.3 10*3/uL (ref 0.1–1.0)
Neutro Abs: 0.7 10*3/uL — ABNORMAL LOW (ref 1.7–7.7)
RBC: 4.26 MIL/uL (ref 4.22–5.81)
RDW: 14.5 % (ref 11.5–15.5)
WBC: 1.6 10*3/uL — ABNORMAL LOW (ref 4.0–10.5)

## 2012-06-13 LAB — URINALYSIS, MICROSCOPIC ONLY

## 2012-06-13 LAB — URINALYSIS, ROUTINE W REFLEX MICROSCOPIC
Glucose, UA: NEGATIVE mg/dL
Hgb urine dipstick: NEGATIVE
Ketones, ur: NEGATIVE mg/dL
pH: 6.5 (ref 5.0–8.0)

## 2012-06-13 LAB — T-HELPER CELL (CD4) - (RCID CLINIC ONLY): CD4 T Cell Abs: 30 uL — ABNORMAL LOW (ref 400–2700)

## 2012-06-13 LAB — PATHOLOGIST SMEAR REVIEW

## 2012-06-26 ENCOUNTER — Telehealth: Payer: Self-pay | Admitting: *Deleted

## 2012-06-26 ENCOUNTER — Ambulatory Visit: Payer: Medicare Other | Admitting: Infectious Diseases

## 2012-06-26 NOTE — Telephone Encounter (Signed)
Called and left message for patient to call the clinic to reschedule his appt, he no showed today. Spencer Mcclure

## 2013-04-19 ENCOUNTER — Encounter (HOSPITAL_COMMUNITY): Payer: Self-pay | Admitting: Emergency Medicine

## 2013-04-19 ENCOUNTER — Emergency Department (HOSPITAL_COMMUNITY)
Admission: EM | Admit: 2013-04-19 | Discharge: 2013-04-19 | Disposition: A | Discharge status: Home / Self Care | Payer: Medicare Other | Attending: Emergency Medicine | Admitting: Emergency Medicine

## 2013-04-19 DIAGNOSIS — Y939 Activity, unspecified: Secondary | ICD-10-CM | POA: Insufficient documentation

## 2013-04-19 DIAGNOSIS — W010XXA Fall on same level from slipping, tripping and stumbling without subsequent striking against object, initial encounter: Secondary | ICD-10-CM | POA: Insufficient documentation

## 2013-04-19 DIAGNOSIS — S79929A Unspecified injury of unspecified thigh, initial encounter: Principal | ICD-10-CM

## 2013-04-19 DIAGNOSIS — Z79899 Other long term (current) drug therapy: Secondary | ICD-10-CM | POA: Insufficient documentation

## 2013-04-19 DIAGNOSIS — Z792 Long term (current) use of antibiotics: Secondary | ICD-10-CM | POA: Insufficient documentation

## 2013-04-19 DIAGNOSIS — J45909 Unspecified asthma, uncomplicated: Secondary | ICD-10-CM | POA: Insufficient documentation

## 2013-04-19 DIAGNOSIS — Y929 Unspecified place or not applicable: Secondary | ICD-10-CM | POA: Insufficient documentation

## 2013-04-19 DIAGNOSIS — M25552 Pain in left hip: Secondary | ICD-10-CM

## 2013-04-19 DIAGNOSIS — S79919A Unspecified injury of unspecified hip, initial encounter: Secondary | ICD-10-CM | POA: Insufficient documentation

## 2013-04-19 MED ORDER — TRAMADOL HCL 50 MG PO TABS
50.0000 mg | ORAL_TABLET | Freq: Once | ORAL | Status: AC
  Administered 2013-04-19: 50 mg via ORAL
  Filled 2013-04-19: qty 1

## 2013-04-19 MED ORDER — ACETAMINOPHEN 500 MG PO TABS: 500.0000 mg | ORAL_TABLET | Freq: Four times a day (QID) | ORAL | Status: AC | PRN

## 2013-04-19 NOTE — ED Notes (Signed)
Pt states he fell 2 weeks ago and has some left hip pain. Pt rates pain 10/10. Pt has not taken anything for pain.

## 2013-04-19 NOTE — ED Provider Notes (Signed)
CSN: [id number]     Arrival date & time 04/19/13  1135 History  This chart was scribed for non-physician practitioner, Junius Finner, PA-C working with Dagmar Hait, MD by Greggory Stallion, ED scribe. This patient was seen in room TR06C/TR06C and the patient's care was started at 12:51 PM.   Chief Complaint  Patient presents with  . Hip Pain   The history is provided by the patient. No language interpreter was used.   HPI Comments: Spencer Mcclure is a 58 y.o. male who presents to the Emergency Department complaining of a fall that occurred two weeks ago when he tripped on the sidewalk. Pt has sudden onset, constant left hip pain. Rates the pain 10/10. Has not taken anything for pain. Palpation worsens the pain. Denies numbness or tingling. Denies difficulty walking. Denies change in bowel or bladder habits. Denies fever, n/v/d.  Past Medical History  Diagnosis Date  . Asthma    Past Surgical History  Procedure Laterality Date  . Abdominal surgery     History reviewed. No pertinent family history. History  Substance Use Topics  . Smoking status: Never Smoker   . Smokeless tobacco: Not on file  . Alcohol Use: No    Review of Systems  Musculoskeletal: Positive for arthralgias.  Neurological: Negative for numbness.  All other systems reviewed and are negative.   Allergies  Review of patient's allergies indicates no known allergies.  Home Medications   Current Outpatient Rx  Name  Route  Sig  Dispense  Refill  . albuterol (PROVENTIL HFA;VENTOLIN HFA) 108 (90 BASE) MCG/ACT inhaler   Inhalation   Inhale 1-2 puffs into the lungs every 6 (six) hours as needed for wheezing.   2 Inhaler   0   . darunavir (PREZISTA) 400 MG tablet   Oral   Take 800 mg by mouth daily.           Marland Kitchen emtricitabine-tenofovir (TRUVADA) 200-300 MG per tablet   Oral   Take 1 tablet by mouth daily.           . Etravirine (INTELENCE) 200 MG TABS   Oral   Take 1 tablet by mouth 2 (two) times  daily.           . Multiple Vitamin (MULTIVITAMIN) capsule   Oral   Take 1 capsule by mouth daily.           . ritonavir (NORVIR) 100 MG TABS   Oral   Take 100 mg by mouth daily.           Marland Kitchen sulfamethoxazole-trimethoprim (BACTRIM DS) 800-160 MG per tablet   Oral   Take 1 tablet by mouth daily.           Marland Kitchen acetaminophen (TYLENOL) 500 MG tablet   Oral   Take 1 tablet (500 mg total) by mouth every 6 (six) hours as needed.   30 tablet   0    BP 119/75  Pulse 98  Temp(Src) 98.1 F (36.7 C) (Oral)  Resp 20  SpO2 98%  Physical Exam  Nursing note and vitals reviewed. Constitutional: He appears well-developed and well-nourished.  HENT:  Head: Normocephalic and atraumatic.  Eyes: Conjunctivae are normal. No scleral icterus.  Neck: Normal range of motion.  Cardiovascular: Normal rate, regular rhythm and normal heart sounds.   Pulmonary/Chest: Effort normal. No respiratory distress.  Musculoskeletal: Normal range of motion.  Soft tissue tenderness above left hip. No ecchymosis or erythema. Skin intact.   Neurological: He is alert.  Skin: Skin is warm and dry.    ED Course  Procedures (including critical care time)  DIAGNOSTIC STUDIES: Oxygen Saturation is 98% on RA, normal by my interpretation.    COORDINATION OF CARE: 12:52 PM-Discussed treatment plan which includes pain medication in the ED and anti-inflammatories with pt at bedside and pt agreed to plan.   Labs Review Labs Reviewed - No data to display Imaging Review No results found.  EKG Interpretation   None       MDM   1. Left hip pain    Pt c/o left hip pain. On exam, no bony tenderness. Do not believe imaging is needed at this time. Will tx with tramadol in ED and Rx: acetaminophen. Advised to f/u with his PCP. Pt verbalized understanding and agreement with tx plan.  I personally performed the services described in this documentation, which was scribed in my presence. The recorded information  has been reviewed and is accurate.   Junius FinnerErin O'Malley, PA-C 04/19/13 [phone]

## 2013-04-19 NOTE — ED Provider Notes (Signed)
Medical screening examination/treatment/procedure(s) were performed by non-physician practitioner and as supervising physician I was immediately available for consultation/collaboration.  EKG Interpretation   None         William Faduma Cho, MD 04/19/13 1621 

## 2013-04-19 NOTE — ED Notes (Signed)
Per pt sts had a fall 2 weeks ago, tripped on sidewalk and has been having left hip and side pain.

## 2013-04-19 NOTE — ED Notes (Signed)
Discharge instructions reviewed with pt. Pt verbalized understanding.   

## 2013-06-04 ENCOUNTER — Telehealth: Payer: Self-pay | Admitting: Infectious Diseases

## 2013-06-04 NOTE — Telephone Encounter (Signed)
Phone does not accept messages.

## 2013-12-19 ENCOUNTER — Emergency Department (HOSPITAL_COMMUNITY): Payer: Medicare Other

## 2013-12-19 ENCOUNTER — Emergency Department (HOSPITAL_COMMUNITY)
Admission: EM | Admit: 2013-12-19 | Discharge: 2013-12-19 | Disposition: A | Discharge status: Home / Self Care | Payer: Medicare Other | Attending: Emergency Medicine | Admitting: Emergency Medicine

## 2013-12-19 ENCOUNTER — Encounter (HOSPITAL_COMMUNITY): Payer: Self-pay | Admitting: Emergency Medicine

## 2013-12-19 DIAGNOSIS — B20 Human immunodeficiency virus [HIV] disease: Secondary | ICD-10-CM | POA: Insufficient documentation

## 2013-12-19 DIAGNOSIS — R05 Cough: Secondary | ICD-10-CM | POA: Diagnosis present

## 2013-12-19 DIAGNOSIS — R059 Cough, unspecified: Secondary | ICD-10-CM | POA: Insufficient documentation

## 2013-12-19 DIAGNOSIS — J0110 Acute frontal sinusitis, unspecified: Secondary | ICD-10-CM | POA: Insufficient documentation

## 2013-12-19 DIAGNOSIS — Z79899 Other long term (current) drug therapy: Secondary | ICD-10-CM | POA: Diagnosis not present

## 2013-12-19 DIAGNOSIS — J45909 Unspecified asthma, uncomplicated: Secondary | ICD-10-CM | POA: Insufficient documentation

## 2013-12-19 HISTORY — DX: Human immunodeficiency virus (HIV) disease: B20

## 2013-12-19 MED ORDER — HYDROCODONE-HOMATROPINE 5-1.5 MG/5ML PO SYRP: 5.0000 mL | ORAL_SOLUTION | Freq: Four times a day (QID) | ORAL | Status: AC | PRN

## 2013-12-19 MED ORDER — AMOXICILLIN-POT CLAVULANATE 500-125 MG PO TABS: 1.0000 | ORAL_TABLET | Freq: Three times a day (TID) | ORAL | Status: AC

## 2013-12-19 NOTE — ED Notes (Signed)
Pt here for cold symptoms, cough, congestion and just not feeling well.

## 2013-12-19 NOTE — Discharge Instructions (Signed)

## 2013-12-19 NOTE — ED Provider Notes (Signed)
CSN: [id number]     Arrival date & time 12/19/13  0906 History  This chart was scribed for non-physician practitioner, Santiago Glad, PA-C, working with Mirian Mo, MD by Charline Bills, ED Scribe. This patient was seen in room TR06C/TR06C and the patient's care was started at 10:06 AM.   Chief Complaint  Patient presents with  . URI   The history is provided by the patient. No language interpreter was used.   HPI Comments: Spencer Mcclure is a 58 y.o. male, with a h/o AIDS, who presents to the Emergency Department complaining of productive cough onset 1 week ago. He reports associated congestion and frontal HA. He denies fever, SOB, chest pain.  He denies any neck pain or stiffness.  No nausea, vomiting, or vision changes.  Pt has not taken any medication for symptoms. Most recent CD4 count was 30 in 05/2012. Pt is followed by Dr. Ninetta Lights with ID.   Past Medical History  Diagnosis Date  . Asthma   . AIDS    Past Surgical History  Procedure Laterality Date  . Abdominal surgery     History reviewed. No pertinent family history. History  Substance Use Topics  . Smoking status: Never Smoker   . Smokeless tobacco: Not on file  . Alcohol Use: No    Review of Systems  Constitutional: Negative for fever.  HENT: Positive for congestion.   Respiratory: Positive for cough. Negative for shortness of breath.   Cardiovascular: Negative for chest pain.  Neurological: Positive for headaches.  All other systems reviewed and are negative.  Allergies  Review of patient's allergies indicates no known allergies.  Home Medications   Prior to Admission medications   Medication Sig Start Date End Date Taking? Authorizing Provider  acetaminophen (TYLENOL) 500 MG tablet Take 1 tablet (500 mg total) by mouth every 6 (six) hours as needed. 04/19/13   Junius Finner, PA-C  albuterol (PROVENTIL HFA;VENTOLIN HFA) 108 (90 BASE) MCG/ACT inhaler Inhale 1-2 puffs into the lungs every 6 (six) hours as needed  for wheezing. 07/24/11 04/19/13  Raeford Razor, MD  darunavir (PREZISTA) 400 MG tablet Take 800 mg by mouth daily.      Historical Provider, MD  emtricitabine-tenofovir (TRUVADA) 200-300 MG per tablet Take 1 tablet by mouth daily.      Historical Provider, MD  Etravirine (INTELENCE) 200 MG TABS Take 1 tablet by mouth 2 (two) times daily.      Historical Provider, MD  Multiple Vitamin (MULTIVITAMIN) capsule Take 1 capsule by mouth daily.      Historical Provider, MD  ritonavir (NORVIR) 100 MG TABS Take 100 mg by mouth daily.      Historical Provider, MD  sulfamethoxazole-trimethoprim (BACTRIM DS) 800-160 MG per tablet Take 1 tablet by mouth daily.      Historical Provider, MD   Triage Vitals: BP 117/74  Pulse 73  Temp(Src) 98.2 F (36.8 C) (Oral)  Resp 18  Ht  (1.676 m)  Wt 140 lb (63.504 kg)  BMI 22.61 kg/m2  SpO2 99% Physical Exam  Nursing note and vitals reviewed. Constitutional: He is oriented to person, place, and time. He appears well-developed and well-nourished.  HENT:  Head: Normocephalic and atraumatic.  Right Ear: Tympanic membrane and ear canal normal.  Left Ear: Tympanic membrane and ear canal normal.  Nose: Mucosal edema (mild, bilaterally) present. Right sinus exhibits frontal sinus tenderness. Right sinus exhibits no maxillary sinus tenderness. Left sinus exhibits frontal sinus tenderness. Left sinus exhibits no maxillary sinus tenderness.  Mouth/Throat: Uvula is midline, oropharynx is clear and moist and mucous membranes are normal.  Eyes: Conjunctivae and EOM are normal.  Neck: Normal range of motion. Neck supple.  Cardiovascular: Normal rate and normal heart sounds.   Pulmonary/Chest: Effort normal and breath sounds normal.  Musculoskeletal: Normal range of motion.  Lymphadenopathy:    He has no cervical adenopathy.  Neurological: He is alert and oriented to person, place, and time.  Skin: Skin is warm and dry.  Psychiatric: He has a normal mood and affect. His  behavior is normal.   ED Course  Procedures (including critical care time) DIAGNOSTIC STUDIES: Oxygen Saturation is 99% on RA, normal by my interpretation.    COORDINATION OF CARE: 10:10 AM-Discussed treatment plan which includes CXR and antibiotics with pt at bedside and pt agreed to plan.   Labs Review Labs Reviewed - No data to display  Imaging Review Dg Chest 2 View  12/19/2013   CLINICAL DATA:  Upper respiratory infection.  Cough and headache.  EXAM: CHEST  2 VIEW  COMPARISON:  Two-view chest 11/16/2010  FINDINGS: The heart size is exaggerated by low lung volumes exaggerate the heart size. Minimal bibasilar airspace disease likely reflects atelectasis. Aeration is improved from the prior study. No focal consolidation is present otherwise. The visualized soft tissues and bony thorax are unremarkable.  IMPRESSION: 1. No acute cardiopulmonary disease or significant interval change. 2. Low lung volumes.   Electronically Signed   By: Gennette Pac M.D.   On: 12/19/2013 09:51    EKG Interpretation None      MDM   Final diagnoses:  None   Patient with a history of AIDS presenting with signs and symptoms consistent with Acute Sinusitis and URI.  Patient non toxic appearing.  VSS.  Patient afebrile.  Pulse ox 99 on RA.   CXR negative.  Patient stable for discharge.  Strict return precautions given.  I personally performed the services described in this documentation, which was scribed in my presence. The recorded information has been reviewed and is accurate.    Santiago Glad, PA-C 12/19/13 [phone]

## 2013-12-20 NOTE — ED Provider Notes (Signed)
Medical screening examination/treatment/procedure(s) were performed by non-physician practitioner and as supervising physician I was immediately available for consultation/collaboration.   EKG Interpretation None        Makira Holleman, MD 12/20/13 1428 

## 2015-12-02 ENCOUNTER — Encounter (HOSPITAL_COMMUNITY): Payer: Self-pay | Admitting: *Deleted

## 2015-12-02 ENCOUNTER — Emergency Department (HOSPITAL_COMMUNITY)
Admission: EM | Admit: 2015-12-02 | Discharge: 2015-12-02 | Disposition: A | Discharge status: Home / Self Care | Payer: Medicare Other | Attending: Emergency Medicine | Admitting: Emergency Medicine

## 2015-12-02 ENCOUNTER — Emergency Department (HOSPITAL_COMMUNITY): Payer: Medicare Other

## 2015-12-02 DIAGNOSIS — M79672 Pain in left foot: Secondary | ICD-10-CM | POA: Diagnosis not present

## 2015-12-02 DIAGNOSIS — J45909 Unspecified asthma, uncomplicated: Secondary | ICD-10-CM | POA: Insufficient documentation

## 2015-12-02 MED ORDER — NAPROXEN 500 MG PO TABS: 500.0000 mg | ORAL_TABLET | Freq: Two times a day (BID) | ORAL | 0 refills | Status: AC

## 2015-12-02 NOTE — Discharge Instructions (Signed)
Please read and follow all provided instructions.  Your diagnoses today include:  1. Foot pain, left     Tests performed today include: Vital signs. See below for your results today.   Medications prescribed:  Take as prescribed   Home care instructions:  Follow any educational materials contained in this packet.  Follow-up instructions: Please follow-up with your primary care provider for further evaluation of symptoms and treatment   Return instructions:  Please return to the Emergency Department if you do not get better, if you get worse, or new symptoms OR  - Fever (temperature greater than 101.67F)  - Bleeding that does not stop with holding pressure to the area    -Severe pain (please note that you may be more sore the day after your accident)  - Chest Pain  - Difficulty breathing  - Severe nausea or vomiting  - Inability to tolerate food and liquids  - Passing out  - Skin becoming red around your wounds  - Change in mental status (confusion or lethargy)  - New numbness or weakness    Please return if you have any other emergent concerns.  Additional Information:  Your vital signs today were: BP 123/81 (BP Location: Right Arm)    Pulse 81    Temp 98 F (36.7 C) (Oral)    Resp 20    SpO2 99%  If your blood pressure (BP) was elevated above 135/85 this visit, please have this repeated by your doctor within one month. ---------------

## 2015-12-02 NOTE — ED Notes (Signed)
Declined W/C at D/C and was escorted to lobby by RN. 

## 2015-12-02 NOTE — ED Provider Notes (Signed)
[facility] DEPT Provider Note   CSN: [id number] Arrival date & time: 12/02/15  1324  By signing my name below, I, Freida Busmaniana Omoyeni, attest that this documentation has been prepared under the direction and in the presence of non-physician practitioner, Audry Piliyler Mykira Hofmeister, PA-C. Electronically Signed: Freida Busmaniana Omoyeni, Scribe. 12/02/2015. 1:55 PM.   History   Chief Complaint Chief Complaint  Patient presents with  . Foot Pain   The history is provided by the patient. No language interpreter was used.    HPI Comments:  Spencer Mcclure is a 60 y.o. male who presents to the Emergency Department complaining of mild-moderate swelling to the left foot x 2 days. Pt reports associated 10/10 pain to the top of the foot. He denies injury. His pain is exacerbated with palpation and when he bears weight on the extremity. No alleviating factors noted; no treatments tried. He denies fever. He also denies DM history.   Past Medical History:  Diagnosis Date  . AIDS (HCC)   . Asthma     Patient Active Problem List   Diagnosis Date Noted  . BRONCHITIS 03/02/2008  . 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 PNEUMONIA, HX OF 08/21/2006    Past Surgical History:  Procedure Laterality Date  . ABDOMINAL SURGERY         Home Medications    Prior to Admission medications   Medication Sig Start Date End Date Taking? Authorizing Provider  acetaminophen (TYLENOL) 500 MG tablet Take 1 tablet (500 mg total) by mouth every 6 (six) hours as needed. 04/19/13   Junius FinnerErin O'Malley, PA-C  albuterol (PROVENTIL HFA;VENTOLIN HFA) 108 (90 BASE) MCG/ACT inhaler Inhale 1-2 puffs into the lungs every 6 (six) hours as needed for wheezing. 07/24/11 04/19/13  Raeford RazorStephen Kohut, MD  amoxicillin-clavulanate (AUGMENTIN) 500-125 MG per tablet Take 1 tablet (500 mg total) by mouth every 8 (eight) hours. 12/19/13   Heather Laisure, PA-C  darunavir (PREZISTA) 400 MG tablet Take 800 mg by mouth daily.      Historical  Provider, MD  emtricitabine-tenofovir (TRUVADA) 200-300 MG per tablet Take 1 tablet by mouth daily.      Historical Provider, MD  Etravirine (INTELENCE) 200 MG TABS Take 1 tablet by mouth 2 (two) times daily.      Historical Provider, MD  HYDROcodone-homatropine (HYCODAN) 5-1.5 MG/5ML syrup Take 5 mLs by mouth every 6 (six) hours as needed for cough. 12/19/13   Santiago GladHeather Laisure, PA-C  Multiple Vitamin (MULTIVITAMIN) capsule Take 1 capsule by mouth daily.      Historical Provider, MD  ritonavir (NORVIR) 100 MG TABS Take 100 mg by mouth daily.      Historical Provider, MD  sulfamethoxazole-trimethoprim (BACTRIM DS) 800-160 MG per tablet Take 1 tablet by mouth daily.      Historical Provider, MD    Family History History reviewed. No pertinent family history.  Social History Social History  Substance Use Topics  . Smoking status: Never Smoker  . Smokeless tobacco: Not on file  . Alcohol use No     Allergies   Review of patient's allergies indicates no known allergies.   Review of Systems Review of Systems  Musculoskeletal: Positive for arthralgias and myalgias.  Neurological: Negative for weakness and numbness.     Physical Exam Updated Vital Signs BP 123/81 (BP Location: Right Arm)   Pulse 81   Temp 98 F (36.7 C) (Oral)   Resp 20   SpO2 99%   Physical Exam  Constitutional: He  is oriented to person, place, and time. He appears well-developed and well-nourished. No distress.  HENT:  Head: Normocephalic and atraumatic.  Eyes: Conjunctivae are normal.  Cardiovascular: Normal rate.   Pulmonary/Chest: Effort normal.  Abdominal: He exhibits no distension.  Musculoskeletal:  along 3rd & 4th MTP on the left with mild swelling  No signs of infection ROM intact NVI Motor senstation intact No erythema or ecchymosis  Neurological: He is alert and oriented to person, place, and time.  Skin: Skin is warm and dry.  Psychiatric: He has a normal mood and affect.  Nursing note and  vitals reviewed.   ED Treatments / Results  DIAGNOSTIC STUDIES:  Oxygen Saturation is 99% on RA, normal by my interpretation.    COORDINATION OF CARE:  1:50 PM Discussed treatment plan with pt at bedside and pt agreed to plan.  Labs (all labs ordered are listed, but only abnormal results are displayed) Labs Reviewed - No data to display  EKG  EKG Interpretation None       Radiology No results found.  Procedures Procedures (including critical care time)  Medications Ordered in ED Medications - No data to display   Initial Impression / Assessment and Plan / ED Course  I have reviewed the triage vital signs and the nursing notes.  Pertinent labs & imaging results that were available during my care of the patient were reviewed by me and considered in my medical decision making (see chart for details).  Clinical Course     Final Clinical Impressions(s) / ED Diagnoses  I have reviewed and evaluated the relevant imaging studies.  I have reviewed the relevant previous healthcare records. I obtained HPI from historian.  ED Course:  Assessment: Patient X-Ray negative for obvious fracture or dislocation.  Pt advised to follow up with PCP. Conservative therapy recommended and discussed. Likely strain on MTPs from abnormal gait as pt walks on his forefoot consistently. Strain likely. No signs of infection. No erythema. No obvious deformities. Patient will be discharged home & is agreeable with above plan. Returns precautions discussed. Pt appears safe for discharge.    Disposition/Plan:  DC Home Additional Verbal discharge instructions given and discussed with patient.  Pt Instructed to f/u with PCP in the next week for evaluation and treatment of symptoms. Return precautions given Pt acknowledges and agrees with plan  Supervising Physician Lyndal Pulleyaniel Knott, MD   Final diagnoses:  Foot pain, left    New Prescriptions New Prescriptions   No medications on file   I  personally performed the services described in this documentation, which was scribed in my presence. The recorded information has been reviewed and is accurate.     Audry Piliyler Fedra Lanter, PA-C 12/02/15 1442    Lyndal Pulleyaniel Knott, MD 12/03/15 [phone]

## 2015-12-02 NOTE — ED Triage Notes (Signed)
Pt reports left foot and swelling x 2 days. Denies injury. Has pain to top of foot.

## 2016-02-03 ENCOUNTER — Emergency Department (HOSPITAL_COMMUNITY): Payer: Medicare Other

## 2016-02-03 ENCOUNTER — Emergency Department (HOSPITAL_COMMUNITY)
Admission: EM | Admit: 2016-02-03 | Discharge: 2016-02-03 | Disposition: A | Discharge status: Home / Self Care | Payer: Medicare Other | Attending: Emergency Medicine | Admitting: Emergency Medicine

## 2016-02-03 ENCOUNTER — Encounter (HOSPITAL_COMMUNITY): Payer: Self-pay | Admitting: Emergency Medicine

## 2016-02-03 DIAGNOSIS — J45909 Unspecified asthma, uncomplicated: Secondary | ICD-10-CM | POA: Diagnosis not present

## 2016-02-03 DIAGNOSIS — Y92480 Sidewalk as the place of occurrence of the external cause: Secondary | ICD-10-CM | POA: Diagnosis not present

## 2016-02-03 DIAGNOSIS — Y939 Activity, unspecified: Secondary | ICD-10-CM | POA: Insufficient documentation

## 2016-02-03 DIAGNOSIS — M25551 Pain in right hip: Secondary | ICD-10-CM | POA: Insufficient documentation

## 2016-02-03 DIAGNOSIS — M791 Myalgia: Secondary | ICD-10-CM | POA: Diagnosis not present

## 2016-02-03 DIAGNOSIS — Y999 Unspecified external cause status: Secondary | ICD-10-CM | POA: Insufficient documentation

## 2016-02-03 DIAGNOSIS — W19XXXA Unspecified fall, initial encounter: Secondary | ICD-10-CM

## 2016-02-03 DIAGNOSIS — W0110XA Fall on same level from slipping, tripping and stumbling with subsequent striking against unspecified object, initial encounter: Secondary | ICD-10-CM | POA: Insufficient documentation

## 2016-02-03 DIAGNOSIS — M7918 Myalgia, other site: Secondary | ICD-10-CM

## 2016-02-03 NOTE — Discharge Instructions (Signed)
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
## 2016-02-03 NOTE — ED Triage Notes (Signed)
Pt. tripped and fell this evening while walking , denies LOC/ambulatory , stated right lower back pain worse with movement /changing positions , denies hematuria or dysuria .

## 2016-03-21 ENCOUNTER — Ambulatory Visit: Payer: Medicare Other

## 2016-04-04 ENCOUNTER — Encounter: Payer: Self-pay | Admitting: Infectious Disease

## 2020-02-22 ENCOUNTER — Emergency Department
Admission: EM | Admit: 2020-02-22 | Discharge: 2020-02-22 | Disposition: A | Discharge status: Home / Self Care | Payer: Medicare Other | Attending: Emergency Medicine | Admitting: Emergency Medicine

## 2020-02-22 ENCOUNTER — Emergency Department: Payer: Medicare Other

## 2020-02-22 DIAGNOSIS — S8002XA Contusion of left knee, initial encounter: Secondary | ICD-10-CM | POA: Insufficient documentation

## 2020-02-22 DIAGNOSIS — S80212A Abrasion, left knee, initial encounter: Secondary | ICD-10-CM | POA: Insufficient documentation

## 2020-02-22 DIAGNOSIS — Y92480 Sidewalk as the place of occurrence of the external cause: Secondary | ICD-10-CM | POA: Insufficient documentation

## 2020-02-22 MED ORDER — ACETAMINOPHEN 500 MG PO TABS
1000.0000 mg | ORAL_TABLET | Freq: Once | ORAL | Status: AC
Start: 2020-02-22 — End: 2020-02-22
  Administered 2020-02-22: 15:00:00 1000 mg via ORAL
  Filled 2020-02-22: qty 2

## 2020-02-22 MED ORDER — KETOROLAC TROMETHAMINE 30 MG/ML IJ SOLN
30.0000 mg | Freq: Once | INTRAMUSCULAR | Status: AC
Start: 2020-02-22 — End: 2020-02-22
  Administered 2020-02-22: 15:00:00 30 mg via INTRAMUSCULAR
  Filled 2020-02-22: qty 1
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
## 2020-02-22 NOTE — ED Notes (Signed)
Bed: E06  Expected date:   Expected time:   Means of arrival:   Comments:  Medic 411
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
## 2020-09-18 ENCOUNTER — Other Ambulatory Visit: Payer: Self-pay

## 2020-09-18 ENCOUNTER — Emergency Department (HOSPITAL_COMMUNITY): Payer: Medicare Other

## 2020-09-18 ENCOUNTER — Encounter (HOSPITAL_COMMUNITY): Payer: Self-pay

## 2020-09-18 ENCOUNTER — Emergency Department (HOSPITAL_COMMUNITY)
Admission: EM | Admit: 2020-09-18 | Discharge: 2020-09-18 | Disposition: A | Discharge status: Home / Self Care | Payer: Medicare Other | Attending: Emergency Medicine | Admitting: Emergency Medicine

## 2020-09-18 DIAGNOSIS — M25552 Pain in left hip: Secondary | ICD-10-CM | POA: Insufficient documentation

## 2020-09-18 DIAGNOSIS — M25559 Pain in unspecified hip: Secondary | ICD-10-CM

## 2020-09-18 DIAGNOSIS — J45909 Unspecified asthma, uncomplicated: Secondary | ICD-10-CM | POA: Insufficient documentation

## 2020-09-18 DIAGNOSIS — Z21 Asymptomatic human immunodeficiency virus [HIV] infection status: Secondary | ICD-10-CM | POA: Diagnosis not present

## 2020-09-18 DIAGNOSIS — W19XXXA Unspecified fall, initial encounter: Secondary | ICD-10-CM | POA: Diagnosis not present

## 2020-09-18 MED ORDER — LIDOCAINE 5 % EX PTCH: 1.0000 | MEDICATED_PATCH | CUTANEOUS | 0 refills | Status: AC

## 2020-09-18 MED ORDER — LIDOCAINE 5 % EX PTCH
1.0000 | MEDICATED_PATCH | CUTANEOUS | Status: DC
  Administered 2020-09-18: 1 via TRANSDERMAL
  Filled 2020-09-18: qty 1

## 2020-09-18 NOTE — Discharge Instructions (Addendum)
Return for any problem.   Use lidoderm patch as prescribed.

## 2020-09-18 NOTE — ED Provider Notes (Signed)
[facility] EMERGENCY DEPARTMENT Provider Note   CSN: [id number] Arrival date & time: 09/18/20  1056     History No chief complaint on file.   Spencer Mcclure is a 65 y.o. male.  66 year old male with prior medical history as detailed below presents for evaluation of reported left-sided hip pain.  Patient reports that he had a fall about 1 month ago.  Patient's left hip has been bothering him since.  He is able to ambulate without difficulty.  He denies fever.  He denies more recent injury.  He reports that he has taken minimal to no OTC medications at home for treatment of his symptoms.  The history is provided by the patient and medical records.  Hip Pain This is a new problem. The current episode started more than 1 week ago. The problem occurs constantly. The problem has not changed since onset.Pertinent negatives include no chest pain and no abdominal pain. Nothing aggravates the symptoms. Nothing relieves the symptoms.       Past Medical History:  Diagnosis Date  . AIDS (HCC)   . Asthma     Patient Active Problem List   Diagnosis Date Noted  . BRONCHITIS 03/02/2008  . 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 PNEUMONIA, HX OF 08/21/2006    Past Surgical History:  Procedure Laterality Date  . ABDOMINAL SURGERY         No family history on file.  Social History   Tobacco Use  . Smoking status: Never Smoker  Substance Use Topics  . Alcohol use: No  . Drug use: No    Home Medications Prior to Admission medications   Medication Sig Start Date End Date Taking? Authorizing Provider  acetaminophen (TYLENOL) 500 MG tablet Take 1 tablet (500 mg total) by mouth every 6 (six) hours as needed. 04/19/13   Lurene Shadow, PA-C  albuterol (PROVENTIL HFA;VENTOLIN HFA) 108 (90 BASE) MCG/ACT inhaler Inhale 1-2 puffs into the lungs every 6 (six) hours as needed for wheezing. 07/24/11 04/19/13  Raeford Razor, MD   amoxicillin-clavulanate (AUGMENTIN) 500-125 MG per tablet Take 1 tablet (500 mg total) by mouth every 8 (eight) hours. 12/19/13   Santiago Glad, PA-C  darunavir (PREZISTA) 400 MG tablet Take 800 mg by mouth daily.      [provider]  emtricitabine-tenofovir (TRUVADA) 200-300 MG per tablet Take 1 tablet by mouth daily.      [provider]  Etravirine (INTELENCE) 200 MG TABS Take 1 tablet by mouth 2 (two) times daily.      [provider]  HYDROcodone-homatropine (HYCODAN) 5-1.5 MG/5ML syrup Take 5 mLs by mouth every 6 (six) hours as needed for cough. 12/19/13   Santiago Glad, PA-C  Multiple Vitamin (MULTIVITAMIN) capsule Take 1 capsule by mouth daily.      [provider]  naproxen (NAPROSYN) 500 MG tablet Take 1 tablet (500 mg total) by mouth 2 (two) times daily. 12/02/15   Audry Pili, PA-C  ritonavir (NORVIR) 100 MG TABS Take 100 mg by mouth daily.      [provider]  sulfamethoxazole-trimethoprim (BACTRIM DS) 800-160 MG per tablet Take 1 tablet by mouth daily.      [provider]    Allergies    Patient has no known allergies.  Review of Systems   Review of Systems  Cardiovascular: Negative for chest pain.  Gastrointestinal: Negative for abdominal pain.  All other systems reviewed and are negative.  Physical Exam Updated Vital Signs BP (!) 118/91 (BP Location: Left Arm)   Pulse 80   Temp 98.9 F (37.2 C) (Oral)   Resp 17   SpO2 99%   Physical Exam Vitals and nursing note reviewed.  Constitutional:      General: He is not in acute distress.    Appearance: Normal appearance. He is well-developed.  HENT:     Head: Normocephalic and atraumatic.  Eyes:     Conjunctiva/sclera: Conjunctivae normal.     Pupils: Pupils are equal, round, and reactive to light.  Cardiovascular:     Rate and Rhythm: Normal rate and regular rhythm.     Heart sounds: Normal heart sounds.  Pulmonary:     Effort: Pulmonary effort is  normal. No respiratory distress.     Breath sounds: Normal breath sounds.  Abdominal:     General: There is no distension.     Palpations: Abdomen is soft.     Tenderness: There is no abdominal tenderness.  Musculoskeletal:        General: No deformity. Normal range of motion.     Cervical back: Normal range of motion and neck supple.     Comments: Mild tenderness to palpation overlying the left lateral hip and left anterior pelvis  No overlying erythema or edema.  No evidence of rash.  Skin:    General: Skin is warm and dry.  Neurological:     Mental Status: He is alert and oriented to person, place, and time.     ED Results / Procedures / Treatments   Labs (all labs ordered are listed, but only abnormal results are displayed) Labs Reviewed - No data to display  EKG None  Radiology No results found.  Procedures Procedures   Medications Ordered in ED Medications  lidocaine (LIDODERM) 5 % 1 patch (has no administration in time range)    ED Course  I have reviewed the triage vital signs and the nursing notes.  Pertinent labs & imaging results that were available during my care of the patient were reviewed by me and considered in my medical decision making (see chart for details).    MDM Rules/Calculators/A&P                          MDM  MSE complete  Spencer Mcclure was evaluated in Emergency Department on 09/18/2020 for the symptoms described in the history of present illness. He was evaluated in the context of the global COVID-19 pandemic, which necessitated consideration that the patient might be at risk for infection with the SARS-CoV-2 virus that causes COVID-19. Institutional protocols and algorithms that pertain to the evaluation of patients at risk for COVID-19 are in a state of rapid change based on information released by regulatory bodies including the CDC and federal and state organizations. These policies and algorithms were followed during the patient's care  in the ED.   Patient with complaint of left hip pain.  Patient may have injured his hip with a fall 1 month prior.  Patient without evidence of significant disability on exam.  X-rays are without evidence of acute fracture.  A Lidoderm patch was placed on the affected area while in the ED and patient reports significant improvement in symptoms  Importance of close follow-up is stressed.  Strict return precautions given understood.   Final Clinical Impression(s) / ED Diagnoses Final diagnoses:  Hip pain    Rx / DC Orders ED Discharge Orders  Ordered    lidocaine (LIDODERM) 5 %  Every 24 hours        09/18/20 1455           Wynetta Fines, MD 09/18/20 1501

## 2020-09-18 NOTE — ED Triage Notes (Signed)
Patient complains of left hip x 1 day, denies injury and pain with any ROM
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
